# Patient Record
Sex: Female | Born: 1965 | Race: White | Hispanic: No | Marital: Married | Smoking: Never smoker
Health system: Southern US, Community
[De-identification: ages and names within clinical notes are randomized; demographics above are authoritative.]

## PROBLEM LIST (undated history)

## (undated) DIAGNOSIS — M1990 Unspecified osteoarthritis, unspecified site: Secondary | ICD-10-CM

## (undated) DIAGNOSIS — C801 Malignant (primary) neoplasm, unspecified: Secondary | ICD-10-CM

## (undated) DIAGNOSIS — E162 Hypoglycemia, unspecified: Secondary | ICD-10-CM

## (undated) HISTORY — PX: LIGAMENT REPAIR: SHX5444

## (undated) HISTORY — PX: SQUAMOUS CELL CARCINOMA EXCISION: SHX2433

## (undated) HISTORY — DX: Hypoglycemia, unspecified: E16.2

## (undated) HISTORY — PX: WISDOM TOOTH EXTRACTION: SHX21

## (undated) HISTORY — PX: BASAL CELL CARCINOMA EXCISION: SHX1214

---

## 2016-06-14 ENCOUNTER — Encounter: Payer: Self-pay | Admitting: Family Medicine

## 2016-06-14 ENCOUNTER — Ambulatory Visit (INDEPENDENT_AMBULATORY_CARE_PROVIDER_SITE_OTHER): Payer: Managed Care, Other (non HMO) | Admitting: Family Medicine

## 2016-06-14 VITALS — BP 108/73 | HR 62 | Resp 18 | Ht 64.0 in | Wt 123.0 lb

## 2016-06-14 DIAGNOSIS — Z01419 Encounter for gynecological examination (general) (routine) without abnormal findings: Secondary | ICD-10-CM | POA: Insufficient documentation

## 2016-06-14 DIAGNOSIS — Z124 Encounter for screening for malignant neoplasm of cervix: Secondary | ICD-10-CM

## 2016-06-14 DIAGNOSIS — Z1151 Encounter for screening for human papillomavirus (HPV): Secondary | ICD-10-CM

## 2016-06-14 MED ORDER — VIENVA 0.1-20 MG-MCG PO TABS: 1.0000 | ORAL_TABLET | Freq: Every day | ORAL | 4 refills | Status: DC

## 2016-06-14 NOTE — Progress Notes (Signed)
   CLINIC ENCOUNTER NOTE  History:  50 y.o. G2P0 here today to establish care. She denies any abnormal vaginal discharge, bleeding, pelvic pain or other concerns. Currently on OCP for pregnancy prevention.   Past Medical History:  Diagnosis Date  . Hypoglycemia     Past Surgical History:  Procedure Laterality Date  . BASAL CELL CARCINOMA EXCISION    . LIGAMENT REPAIR    . SQUAMOUS CELL CARCINOMA EXCISION    . WISDOM TOOTH EXTRACTION      The following portions of the patient's history were reviewed and updated as appropriate: allergies, current medications, past family history, past medical history, past social history, past surgical history and problem list.   Health Maintenance:  Normal pap approximately 2-3 years ago.   Review of Systems:  Pertinent items noted in HPI and remainder of comprehensive ROS otherwise negative.  Objective:  Physical Exam BP 108/73 (BP Location: Left Arm, Patient Position: Sitting, Cuff Size: Normal)   Pulse 62   Resp 18   Ht 5\' 4"  (1.626 m)   Wt 123 lb (55.8 kg)   LMP 06/07/2016   BMI 21.11 kg/m  CONSTITUTIONAL: Well-developed, well-nourished female in no acute distress.  HENT:  Normocephalic, atraumatic. External right and left ear normal. Oropharynx is clear and moist EYES: Conjunctivae and EOM are normal. Pupils are equal, round, and reactive to light. No scleral icterus.  NECK: Normal range of motion, supple, no masses SKIN: Skin is warm and dry. No rash noted. Not diaphoretic. No erythema. No pallor. NEUROLGIC: Alert and oriented to person, place, and time. Normal reflexes, muscle tone coordination. No cranial nerve deficit noted. PSYCHIATRIC: Normal mood and affect. Normal behavior. Normal judgment and thought content. CARDIOVASCULAR: Normal heart rate noted RESPIRATORY: Effort and breath sounds normal, no problems with respiration noted ABDOMEN: Soft, no distention noted.   PELVIC: Normal appearing external genitalia; normal  appearing vaginal mucosa and cervix.  No abnormal discharge noted.  Normal uterine size, no other palpable masses, no uterine or adnexal tenderness. MUSCULOSKELETAL: Normal range of motion. No edema noted.  Labs and Imaging No results found.  Assessment & Plan:  1. Well woman exam with routine gynecological exam - Discussed need for CRC screening - Pap IG and HPV (high risk) DNA detection - MM DIGITAL SCREENING BILATERAL; Future - discussed continuing OCP until 8651 then stopping. Consider LH/FSH after 3-6 months. Recommend use of condoms for birth control until confirmed menopausal  Routine preventative health maintenance measures emphasized. Please refer to After Visit Summary for other counseling recommendations.   Return in about 1 year (around 06/14/2017) for Well woman.

## 2016-06-15 ENCOUNTER — Encounter: Payer: Self-pay | Admitting: *Deleted

## 2016-06-20 LAB — PAP IG AND HPV HIGH-RISK
HPV, high-risk: NEGATIVE
PAP Smear Comment: 0

## 2016-07-18 ENCOUNTER — Other Ambulatory Visit: Payer: Self-pay | Admitting: Family Medicine

## 2016-07-18 ENCOUNTER — Ambulatory Visit
Admission: RE | Admit: 2016-07-18 | Discharge: 2016-07-18 | Disposition: A | Discharge status: Home / Self Care | Payer: Managed Care, Other (non HMO) | Source: Ambulatory Visit | Attending: Family Medicine | Admitting: Family Medicine

## 2016-07-18 DIAGNOSIS — Z1231 Encounter for screening mammogram for malignant neoplasm of breast: Secondary | ICD-10-CM | POA: Insufficient documentation

## 2016-07-18 DIAGNOSIS — Z01419 Encounter for gynecological examination (general) (routine) without abnormal findings: Secondary | ICD-10-CM

## 2016-08-01 ENCOUNTER — Other Ambulatory Visit: Payer: Self-pay | Admitting: *Deleted

## 2016-08-01 ENCOUNTER — Ambulatory Visit
Admission: RE | Admit: 2016-08-01 | Discharge: 2016-08-01 | Disposition: A | Discharge status: Home / Self Care | Payer: Self-pay | Source: Ambulatory Visit | Attending: *Deleted | Admitting: *Deleted

## 2016-08-01 DIAGNOSIS — Z9289 Personal history of other medical treatment: Secondary | ICD-10-CM

## 2016-09-12 HISTORY — PX: COLONOSCOPY: SHX174

## 2017-06-26 ENCOUNTER — Ambulatory Visit: Payer: Managed Care, Other (non HMO) | Admitting: Family Medicine

## 2017-06-28 ENCOUNTER — Other Ambulatory Visit: Payer: Self-pay | Admitting: Family Medicine

## 2017-06-28 DIAGNOSIS — Z760 Encounter for issue of repeat prescription: Secondary | ICD-10-CM

## 2017-07-02 ENCOUNTER — Other Ambulatory Visit (HOSPITAL_COMMUNITY): Payer: Self-pay | Admitting: Family Medicine

## 2017-07-03 NOTE — Telephone Encounter (Signed)
-----   Message from Lindell Spar, [place] sent at 07/02/2017  2:13 PM EDT ----- Regarding: Refill for [facility] Please call in 1 month refill for [facility] to [facility] in [place], Pt scheduled for AE 07/2017 with Alvester Morin

## 2017-07-25 ENCOUNTER — Encounter: Payer: Self-pay | Admitting: Family Medicine

## 2017-07-25 ENCOUNTER — Ambulatory Visit (INDEPENDENT_AMBULATORY_CARE_PROVIDER_SITE_OTHER): Payer: Managed Care, Other (non HMO) | Admitting: Family Medicine

## 2017-07-25 VITALS — BP 89/41 | HR 61 | Wt 121.1 lb

## 2017-07-25 DIAGNOSIS — Z760 Encounter for issue of repeat prescription: Secondary | ICD-10-CM

## 2017-07-25 DIAGNOSIS — Z01419 Encounter for gynecological examination (general) (routine) without abnormal findings: Secondary | ICD-10-CM

## 2017-07-25 MED ORDER — SRONYX 0.1-20 MG-MCG PO TABS: 1.0000 | ORAL_TABLET | Freq: Every day | ORAL | 0 refills | Status: DC

## 2017-07-25 MED ORDER — SRONYX 0.1-20 MG-MCG PO TABS: 1.0000 | ORAL_TABLET | Freq: Every day | ORAL | 3 refills | Status: DC

## 2017-07-25 NOTE — Progress Notes (Signed)
   CLINIC ENCOUNTER NOTE  History:  51 y.o. G2P0 here today for yearly wellness exam. She denies any abnormal vaginal discharge, bleeding, pelvic pain or other concerns.   Past Medical History:  Diagnosis Date  . Hypoglycemia     Past Surgical History:  Procedure Laterality Date  . BASAL CELL CARCINOMA EXCISION    . LIGAMENT REPAIR    . SQUAMOUS CELL CARCINOMA EXCISION    . WISDOM TOOTH EXTRACTION      The following portions of the patient's history were reviewed and updated as appropriate: allergies, current medications, past family history, past medical history, past social history, past surgical history and problem list.   Health Maintenance:  Normal pap and negative HRHPV on 06/14/2016.  Normal mammogram on 08/01/2016.   Review of Systems:  Pertinent items noted in HPI and remainder of comprehensive ROS otherwise negative.   Objective:  Physical Exam BP (!) 89/41   Pulse 61   Wt 121 lb 1.6 oz (54.9 kg)   BMI 20.79 kg/m  CONSTITUTIONAL: Well-developed, well-nourished female in no acute distress.  HENT:  Normocephalic, atraumatic. External right and left ear normal. Oropharynx is clear and moist EYES: Conjunctivae and EOM are normal. Pupils are equal, round, and reactive to light. No scleral icterus.  NECK: Normal range of motion, supple, no masses SKIN: Skin is warm and dry. No rash noted. Not diaphoretic. No erythema. No pallor. NEUROLGIC: Alert and oriented to person, place, and time. Normal reflexes, muscle tone coordination. No cranial nerve deficit noted. PSYCHIATRIC: Normal mood and affect. Normal behavior. Normal judgment and thought content. CARDIOVASCULAR: Normal heart rate noted RESPIRATORY: Effort and breath sounds normal, no problems with respiration noted BREAST: Skin normal in appearance. Symmetric breasts with inverted nipples bilaterally. No nipple discharge. No palpable masses bilaterally.  ABDOMEN: Soft, no distention noted.   PELVIC:  deferred MUSCULOSKELETAL: Normal range of motion. No edema noted.  Labs and Imaging No results found.  Assessment & Plan:  1. Well woman exam with routine gynecological exam - labs will be draw at [facility] - Follicle stimulating hormone - LH - Lipid panel - MM Digital Screening; Future - Colonoscopy performed 09/12/2016 - Needs Flu and Tdap- CMA will call to ask about returning to receive.   2. Medication refill Discussed transition to menopause. Reviewed typical age of menopause. Her sister started menopause at age 154. She denies any sx today and would like to continue OCP. We will check FSH/LH today and then again next year when patient has been off medication for 1 month.  - SRONYX 0.1-20 MG-MCG tablet; Take 1 tablet by mouth daily.  Dispense: 84 tablet; Refill: 3   Routine preventative health maintenance measures emphasized. Please refer to After Visit Summary for other counseling recommendations.   Return in about 1 year (around 07/25/2018) for Yearly wellness exam.

## 2017-07-25 NOTE — Progress Notes (Signed)
Normal pap 06/2016  Does not want pap smear today Would like a head written rx for birth control

## 2017-07-26 LAB — LIPID PANEL
CHOL/HDL RATIO: 2.4 ratio (ref 0.0–4.4)
Cholesterol, Total: 167 mg/dL (ref 100–199)
HDL: 70 mg/dL (ref >39)
LDL CALC: 74 mg/dL (ref 0–99)
TRIGLYCERIDES: 114 mg/dL (ref 0–149)
VLDL CHOLESTEROL CAL: 23 mg/dL (ref 5–40)

## 2017-07-26 LAB — FOLLICLE STIMULATING HORMONE: FSH: 13.4 m[IU]/mL

## 2017-07-26 LAB — LUTEINIZING HORMONE: LH: 6.6 m[IU]/mL

## 2017-08-06 ENCOUNTER — Encounter: Payer: Self-pay | Admitting: *Deleted

## 2017-08-07 ENCOUNTER — Encounter: Payer: Self-pay | Admitting: *Deleted

## 2017-08-14 ENCOUNTER — Ambulatory Visit
Admission: RE | Admit: 2017-08-14 | Discharge: 2017-08-14 | Disposition: A | Discharge status: Home / Self Care | Payer: Managed Care, Other (non HMO) | Source: Ambulatory Visit | Attending: Family Medicine | Admitting: Family Medicine

## 2017-08-14 DIAGNOSIS — Z1231 Encounter for screening mammogram for malignant neoplasm of breast: Secondary | ICD-10-CM | POA: Diagnosis not present

## 2017-08-14 DIAGNOSIS — Z01419 Encounter for gynecological examination (general) (routine) without abnormal findings: Secondary | ICD-10-CM

## 2017-08-21 ENCOUNTER — Other Ambulatory Visit: Payer: Self-pay | Admitting: *Deleted

## 2017-08-21 DIAGNOSIS — Z760 Encounter for issue of repeat prescription: Secondary | ICD-10-CM

## 2017-08-21 MED ORDER — SRONYX 0.1-20 MG-MCG PO TABS: 1.0000 | ORAL_TABLET | Freq: Every day | ORAL | 3 refills | Status: DC

## 2017-08-21 NOTE — Addendum Note (Signed)
Addended by: Arne ClevelandHUTCHINSON, MANDY J on: 08/21/2017 01:45 PM   Modules accepted: Orders

## 2018-05-31 ENCOUNTER — Other Ambulatory Visit: Payer: Self-pay | Admitting: Obstetrics & Gynecology

## 2018-05-31 DIAGNOSIS — Z760 Encounter for issue of repeat prescription: Secondary | ICD-10-CM

## 2018-06-06 ENCOUNTER — Encounter: Payer: Self-pay | Admitting: Radiology

## 2018-06-16 ENCOUNTER — Other Ambulatory Visit: Payer: Self-pay | Admitting: Obstetrics & Gynecology

## 2018-06-16 DIAGNOSIS — Z760 Encounter for issue of repeat prescription: Secondary | ICD-10-CM

## 2018-07-18 ENCOUNTER — Other Ambulatory Visit: Payer: Self-pay | Admitting: Internal Medicine

## 2018-07-18 ENCOUNTER — Other Ambulatory Visit (HOSPITAL_COMMUNITY): Payer: Self-pay | Admitting: Family Medicine

## 2018-07-18 DIAGNOSIS — Z1231 Encounter for screening mammogram for malignant neoplasm of breast: Secondary | ICD-10-CM

## 2018-08-15 ENCOUNTER — Ambulatory Visit
Admission: RE | Admit: 2018-08-15 | Discharge: 2018-08-15 | Disposition: A | Discharge status: Home / Self Care | Payer: Managed Care, Other (non HMO) | Source: Ambulatory Visit | Attending: Internal Medicine | Admitting: Internal Medicine

## 2018-08-15 DIAGNOSIS — Z1231 Encounter for screening mammogram for malignant neoplasm of breast: Secondary | ICD-10-CM | POA: Diagnosis present

## 2018-12-04 ENCOUNTER — Encounter: Payer: Self-pay | Admitting: Family Medicine

## 2018-12-04 ENCOUNTER — Ambulatory Visit (INDEPENDENT_AMBULATORY_CARE_PROVIDER_SITE_OTHER): Payer: 59 | Admitting: Family Medicine

## 2018-12-04 VITALS — BP 106/74 | HR 71 | Ht 64.0 in | Wt 118.0 lb

## 2018-12-04 DIAGNOSIS — Z01419 Encounter for gynecological examination (general) (routine) without abnormal findings: Secondary | ICD-10-CM

## 2018-12-04 DIAGNOSIS — Z780 Asymptomatic menopausal state: Secondary | ICD-10-CM

## 2018-12-04 MED ORDER — VENLAFAXINE HCL ER 37.5 MG PO CP24: 75.0000 mg | ORAL_CAPSULE | Freq: Every day | ORAL | 11 refills | Status: DC

## 2018-12-04 MED ORDER — TRAZODONE HCL 50 MG PO TABS: 50.0000 mg | ORAL_TABLET | Freq: Every evening | ORAL | 1 refills | Status: DC | PRN

## 2018-12-04 NOTE — Progress Notes (Signed)
Needs referral for mammo this year

## 2018-12-04 NOTE — Progress Notes (Signed)
   CLINIC ENCOUNTER NOTE  History:  53 y.o. G2P0 here today for yearly wellness exam. She denies any abnormal vaginal discharge, bleeding, pelvic pain or other concerns. Has stopped OCPs since last visit and now has menopausal sx.   Past Medical History:  Diagnosis Date  . Hypoglycemia     Past Surgical History:  Procedure Laterality Date  . BASAL CELL CARCINOMA EXCISION    . LIGAMENT REPAIR    . SQUAMOUS CELL CARCINOMA EXCISION    . WISDOM TOOTH EXTRACTION      The following portions of the patient's history were reviewed and updated as appropriate: allergies, current medications, past family history, past medical history, past social history, past surgical history and problem list.   Health Maintenance:  Normal pap and negative HRHPV on 06/14/2016.  Normal mammogram on 08/2018.   Review of Systems:  Pertinent items noted in HPI and remainder of comprehensive ROS otherwise negative.   Objective:  Physical Exam BP 106/74   Pulse 71   Ht 5\' 4"  (1.626 m)   Wt 118 lb (53.5 kg)   BMI 20.25 kg/m  CONSTITUTIONAL: Well-developed, well-nourished female in no acute distress.  HENT:  Normocephalic, atraumatic.  EYES:  No scleral icterus.  NECK: Normal range of motion, supple, no masses SKIN: Skin is warm and dry. No rash noted. Not diaphoretic. No erythema. No pallor. NEUROLGIC: Alert and oriented to person, place, and time.  PSYCHIATRIC: Normal mood and affect. Normal behavior. Normal judgment and thought content. CARDIOVASCULAR: Normal heart rate noted RESPIRATORY: Effort and breath sounds normal, no problems with respiration noted BREAST: Skin normal in appearance. Symmetric breasts with inverted nipples bilaterally. No nipple discharge. No palpable masses bilaterally.  ABDOMEN: Soft, no distention noted.   PELVIC: deferred MUSCULOSKELETAL: Normal range of motion. No edema noted.  Labs and Imaging No results found.  Assessment & Plan:   1. Menopause - LH/FSH to confirm  menopause now that she is 1 month since stopping OCP -  Having hot flashes, irritability, stiffness and sleep disturbance - venlafaxine XR (EFFEXOR-XR) 37.5 MG 24 hr capsule; Take 2 capsules (75 mg total) by mouth daily.  Dispense: 30 capsule; Refill: 11 - traZODone (DESYREL) 50 MG tablet; Take 1 tablet (50 mg total) by mouth at bedtime as needed for sleep (take 1/2 tablet to 1 tablet).  Dispense: 30 tablet; Refill: 1  2. Well woman exam with routine gynecological exam - MM 3D SCREEN BREAST BILATERAL; Future - CBC   Routine preventative health maintenance measures emphasized. Please refer to After Visit Summary for other counseling recommendations.   Return in about 1 year (around 12/05/2019) for Yearly wellness exam.

## 2018-12-05 LAB — CBC
HEMOGLOBIN: 14.3 g/dL (ref 11.1–15.9)
Hematocrit: 42.1 % (ref 34.0–46.6)
MCH: 30.4 pg (ref 26.6–33.0)
MCHC: 34 g/dL (ref 31.5–35.7)
MCV: 90 fL (ref 79–97)
PLATELETS: 275 10*3/uL (ref 150–450)
RBC: 4.7 x10E6/uL (ref 3.77–5.28)
RDW: 11.9 % (ref 11.7–15.4)
WBC: 8.4 10*3/uL (ref 3.4–10.8)

## 2018-12-05 LAB — LUTEINIZING HORMONE: LH: 49.1 m[IU]/mL

## 2018-12-05 LAB — FOLLICLE STIMULATING HORMONE: FSH: 109.4 m[IU]/mL

## 2018-12-09 ENCOUNTER — Telehealth: Payer: Self-pay | Admitting: *Deleted

## 2018-12-09 NOTE — Telephone Encounter (Signed)
Informed pt of lab results  

## 2018-12-09 NOTE — Telephone Encounter (Signed)
-----   Message from Federico Flake, MD sent at 12/06/2018 11:45 AM EST ----- [facility] and [facility] are both consistent with having gone through menopause. Hemoglobin is WNL thus not anemic.

## 2019-01-26 ENCOUNTER — Other Ambulatory Visit: Payer: Self-pay | Admitting: Family Medicine

## 2019-01-26 DIAGNOSIS — Z780 Asymptomatic menopausal state: Secondary | ICD-10-CM

## 2019-05-12 ENCOUNTER — Other Ambulatory Visit: Payer: Self-pay

## 2019-05-12 ENCOUNTER — Ambulatory Visit (INDEPENDENT_AMBULATORY_CARE_PROVIDER_SITE_OTHER): Payer: 59 | Admitting: *Deleted

## 2019-05-12 VITALS — BP 104/70 | HR 73

## 2019-05-12 DIAGNOSIS — N898 Other specified noninflammatory disorders of vagina: Secondary | ICD-10-CM | POA: Diagnosis not present

## 2019-05-12 MED ORDER — FLUCONAZOLE 150 MG PO TABS: 150.0000 mg | ORAL_TABLET | Freq: Once | ORAL | 1 refills | Status: AC

## 2019-05-12 NOTE — Progress Notes (Signed)
Patient seen and assessed by nursing staff.  Agree with documentation and plan.  

## 2019-05-12 NOTE — Progress Notes (Signed)
SUBJECTIVE:  53 y.o. female complains of vaginal irritation, itchy and redness  for 4-5 day(s). Denies abnormal vaginal bleeding or significant pelvic pain or fever. No UTI symptoms. Denies history of known exposure to STD.Pt tried OTC Monistat and it did get better, but has now gotten worse again.  No LMP recorded. (Menstrual status: Perimenopausal).  OBJECTIVE:  She appears well, afebrile. Urine dipstick: not done.  ASSESSMENT:  Vaginal Irritaiton    PLAN:  BVAG, CVAG probe sent to lab. Treatment: Diflucan sent into to pharmacy, and will adjust pending lab results. ROV prn if symptoms persist or worsen.

## 2019-05-14 LAB — CERVICOVAGINAL ANCILLARY ONLY
Bacterial vaginitis: NEGATIVE
Candida vaginitis: NEGATIVE

## 2019-05-15 ENCOUNTER — Telehealth: Payer: Self-pay

## 2019-05-15 NOTE — Telephone Encounter (Signed)
-----   Message from Donnamae Jude, MD sent at 05/15/2019  3:52 PM EDT ----- Her labs were normal.--maybe check to see if her symptoms have improved.

## 2019-05-15 NOTE — Telephone Encounter (Signed)
Patient has been informed of negative BV and yeast results.

## 2019-08-27 ENCOUNTER — Ambulatory Visit
Admission: RE | Admit: 2019-08-27 | Discharge: 2019-08-27 | Disposition: A | Discharge status: Home / Self Care | Payer: 59 | Source: Ambulatory Visit | Attending: Family Medicine | Admitting: Family Medicine

## 2019-08-27 DIAGNOSIS — Z1231 Encounter for screening mammogram for malignant neoplasm of breast: Secondary | ICD-10-CM | POA: Insufficient documentation

## 2019-08-27 DIAGNOSIS — Z780 Asymptomatic menopausal state: Secondary | ICD-10-CM | POA: Diagnosis not present

## 2019-09-17 ENCOUNTER — Encounter: Payer: Self-pay | Admitting: Radiology

## 2019-12-31 ENCOUNTER — Ambulatory Visit (INDEPENDENT_AMBULATORY_CARE_PROVIDER_SITE_OTHER): Payer: 59 | Admitting: Family Medicine

## 2019-12-31 ENCOUNTER — Encounter: Payer: Self-pay | Admitting: Family Medicine

## 2019-12-31 ENCOUNTER — Other Ambulatory Visit: Payer: Self-pay

## 2019-12-31 VITALS — BP 106/67 | HR 72 | Wt 124.3 lb

## 2019-12-31 DIAGNOSIS — Z780 Asymptomatic menopausal state: Secondary | ICD-10-CM

## 2019-12-31 DIAGNOSIS — Z01419 Encounter for gynecological examination (general) (routine) without abnormal findings: Secondary | ICD-10-CM | POA: Diagnosis not present

## 2019-12-31 DIAGNOSIS — N951 Menopausal and female climacteric states: Secondary | ICD-10-CM | POA: Diagnosis not present

## 2019-12-31 MED ORDER — VENLAFAXINE HCL ER 37.5 MG PO CP24: 75.0000 mg | ORAL_CAPSULE | Freq: Every day | ORAL | 12 refills | Status: DC

## 2019-12-31 NOTE — Progress Notes (Signed)
CLINIC ENCOUNTER NOTE  History:  54 y.o. G2P0 here today for yearly wellness exam. Dx with menopause in 11/2018. Started effexor with good effect. Tried to come off in Dec 2020 and was irritable. Reports mild constipation. Reports some weight gain since last visit. Trouble losing weight since going through menopause. .She denies any abnormal vaginal discharge, bleeding, pelvic pain or other concerns. Has stopped OCPs since last visit and now has menopausal sx.   Past Medical History:  Diagnosis Date  . Hypoglycemia     Past Surgical History:  Procedure Laterality Date  . BASAL CELL CARCINOMA EXCISION    . LIGAMENT REPAIR    . SQUAMOUS CELL CARCINOMA EXCISION    . WISDOM TOOTH EXTRACTION      The following portions of the patient's history were reviewed and updated as appropriate: allergies, current medications, past family history, past medical history, past social history, past surgical history and problem list.   Health Maintenance:  Normal pap and negative HRHPV on 06/14/2016.  Normal mammogram on 08/2018.   Review of Systems:  Pertinent items noted in HPI and remainder of comprehensive ROS otherwise negative.   Objective:  Physical Exam BP 106/67   Pulse 72   Wt 124 lb 4.8 oz (56.4 kg)   LMP 07/24/2018   BMI 21.34 kg/m    Wt Readings from Last 3 Encounters:  12/31/19 124 lb 4.8 oz (56.4 kg)  12/04/18 118 lb (53.5 kg)  07/25/17 121 lb 1.6 oz (54.9 kg)   CONSTITUTIONAL: Well-developed, well-nourished female in no acute distress.  HENT:  Normocephalic, atraumatic.  EYES:  No scleral icterus.  NECK: Normal range of motion, supple, no masses SKIN: Skin is warm and dry. No rash noted. Not diaphoretic. No erythema. No pallor. NEUROLGIC: Alert and oriented to person, place, and time.  PSYCHIATRIC: Normal mood and affect. Normal behavior. Normal judgment and thought content. CARDIOVASCULAR: Normal heart rate noted RESPIRATORY: Effort and breath sounds normal, no problems  with respiration noted BREAST: Skin normal in appearance. Symmetric breasts with inverted nipples bilaterally. No nipple discharge. No palpable masses bilaterally.  ABDOMEN: Soft, no distention noted.   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 Sees derm for this. Normal appearing external genitalia. Normal vaginal mucosa. Normal appearing cervix. Uterus is normal and non tender. No adnexal masses.  MUSCULOSKELETAL: Normal range of motion. No edema noted.  Labs and Imaging No results found.  Assessment & Plan:    1. Well woman exam with routine gynecological exam - plans for mammogram in Nov 2021 - Reviewed healthy diet and weight - Reviewed that her BMI is still very normal (Body mass index is 21.34 kg/m.) and some extra weight can help prevent excelerated bone loss due to adipose tissue making estrogen.  - Emphasized preventative measures- plans for Dexa scan and UTD on CRC screening. - IGP, Aptima HPV, rfx 16/18,45  2. Menopausal state - IGP, Aptima HPV, rfx 16/18,45  3. Menopause Likes effexor, did try to come off in Dec 2020 but was irritable. Discussed that some epopl can have some withdrawal sx. Recommended if she wants to come off effexor.she should be off for 1-2 wks to see how sx are off medication.  - venlafaxine XR (EFFEXOR-XR) 37.5 MG 24 hr capsule; Take 2 capsules (75 mg total) by mouth daily.  Dispense: 30 capsule; Refill: 12 - IGP, Aptima HPV, rfx 16/18,45   Routine preventative health maintenance measures emphasized. Please refer to After Visit Summary for other counseling recommendations.   Return  in about 1 year (around 12/30/2020) for Yearly wellness exam.

## 2019-12-31 NOTE — Progress Notes (Signed)
Last pap 06/2016 - HPV DETECTION Mammogram 08/27/2019- normal

## 2020-01-02 ENCOUNTER — Ambulatory Visit: Payer: 59 | Attending: Internal Medicine

## 2020-01-02 DIAGNOSIS — Z23 Encounter for immunization: Secondary | ICD-10-CM

## 2020-01-02 NOTE — Progress Notes (Signed)
   Covid-19 Vaccination Clinic  Name:  Ashley Hutchinson    MRN: [id number] DOB: 1966-06-19  01/02/2020  [name] was observed post Covid-19 immunization for 30 minutes based on pre-vaccination screening without incident. She was provided with Vaccine Information Sheet and instruction to access the V-Safe system.   [name] was instructed to call 911 with any severe reactions post vaccine: Marland Kitchen Difficulty breathing  . Swelling of face and throat  . A fast heartbeat  . A bad rash all over body  . Dizziness and weakness   Immunizations Administered    Name Date Dose VIS Date Route   Pfizer COVID-19 Vaccine 01/02/2020  9:41 AM 0.3 mL 09/19/2019 Intramuscular   Manufacturer: [employer], [employer]   Lot: [id number]   NDC: [address]-8

## 2020-01-03 ENCOUNTER — Encounter: Payer: Self-pay | Admitting: Family Medicine

## 2020-01-03 LAB — IGP, APTIMA HPV, RFX 16/18,45
HPV Aptima: NEGATIVE
PAP Smear Comment: 0

## 2020-01-07 ENCOUNTER — Telehealth: Payer: Self-pay | Admitting: *Deleted

## 2020-01-07 NOTE — Telephone Encounter (Signed)
-----   Message from Kimberly Niles Newton, MD sent at 01/06/2020  4:24 PM EDT ----- NIL HPV negative, next pap in 5 years 

## 2020-01-07 NOTE — Telephone Encounter (Signed)
Pt informed of normal pap results.  ?

## 2020-01-07 NOTE — Telephone Encounter (Signed)
-----   Message from Federico Flake, MD sent at 01/06/2020  4:24 PM EDT ----- NIL HPV negative, next pap in 5 years

## 2020-01-16 ENCOUNTER — Other Ambulatory Visit: Payer: Self-pay | Admitting: Family Medicine

## 2020-01-16 DIAGNOSIS — Z780 Asymptomatic menopausal state: Secondary | ICD-10-CM

## 2020-01-26 ENCOUNTER — Ambulatory Visit: Payer: 59 | Attending: Internal Medicine

## 2020-01-26 DIAGNOSIS — Z23 Encounter for immunization: Secondary | ICD-10-CM

## 2020-01-26 NOTE — Progress Notes (Signed)
   Covid-19 Vaccination Clinic  Name:  Ashley Hutchinson    MRN: [id number] DOB: May 04, 1966  01/26/2020  Ashley Hutchinson was observed post Covid-19 immunization for 15 minutes without incident. She was provided with Vaccine Information Sheet and instruction to access the V-Safe system.   Ashley Hutchinson was instructed to call 911 with any severe reactions post vaccine: Marland Kitchen Difficulty breathing  . Swelling of face and throat  . A fast heartbeat  . A bad rash all over body  . Dizziness and weakness   Immunizations Administered    Name Date Dose VIS Date Route   Pfizer COVID-19 Vaccine 01/26/2020  2:41 PM 0.3 mL 12/03/2018 Intramuscular   Manufacturer: [employer], [employer]   Lot: [id number]   NDC: [address]-0

## 2020-03-01 DIAGNOSIS — M65332 Trigger finger, left middle finger: Secondary | ICD-10-CM

## 2020-03-01 HISTORY — DX: Trigger finger, left middle finger: M65.332

## 2020-07-20 ENCOUNTER — Other Ambulatory Visit: Payer: Self-pay | Admitting: Family Medicine

## 2020-07-20 DIAGNOSIS — Z1231 Encounter for screening mammogram for malignant neoplasm of breast: Secondary | ICD-10-CM

## 2020-09-07 ENCOUNTER — Ambulatory Visit
Admission: RE | Admit: 2020-09-07 | Discharge: 2020-09-07 | Disposition: A | Discharge status: Home / Self Care | Payer: No Typology Code available for payment source | Source: Ambulatory Visit | Attending: Family Medicine | Admitting: Family Medicine

## 2020-09-07 ENCOUNTER — Other Ambulatory Visit: Payer: Self-pay

## 2020-09-07 DIAGNOSIS — Z1231 Encounter for screening mammogram for malignant neoplasm of breast: Secondary | ICD-10-CM | POA: Diagnosis present

## 2020-10-26 ENCOUNTER — Encounter: Payer: Self-pay | Admitting: Radiology

## 2020-11-23 HISTORY — PX: DERMABRASION: SHX610

## 2021-04-19 IMAGING — MG DIGITAL SCREENING BILAT W/ TOMO W/ CAD
8 series · 9 of 24 positions shown · non-contrast
Comparison: Previous exam(s).

CLINICAL DATA: Screening.

EXAM:
DIGITAL SCREENING BILATERAL MAMMOGRAM WITH TOMO AND CAD

[L CC synth-2D]
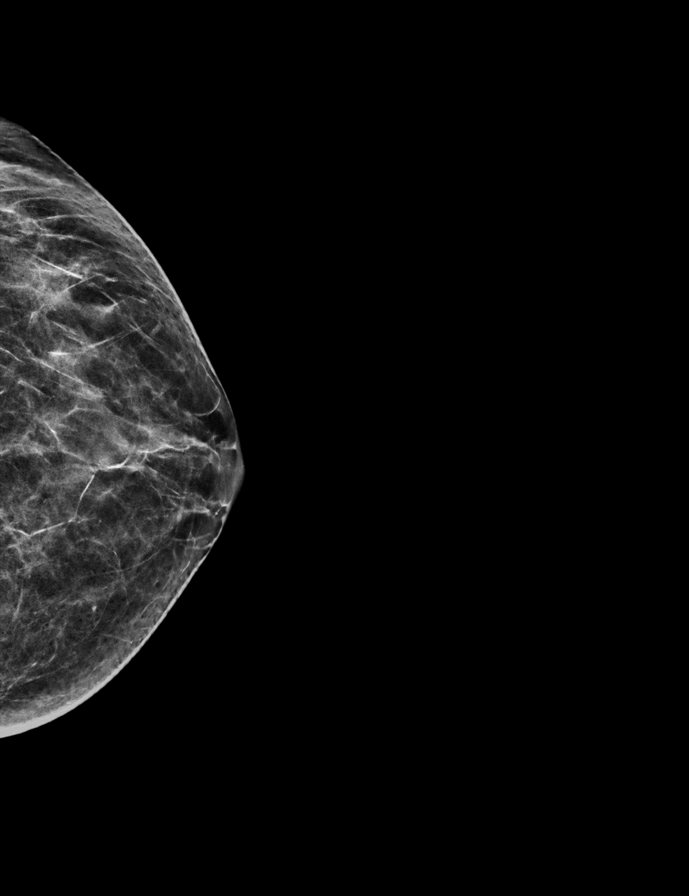

[L MLO synth-2D]
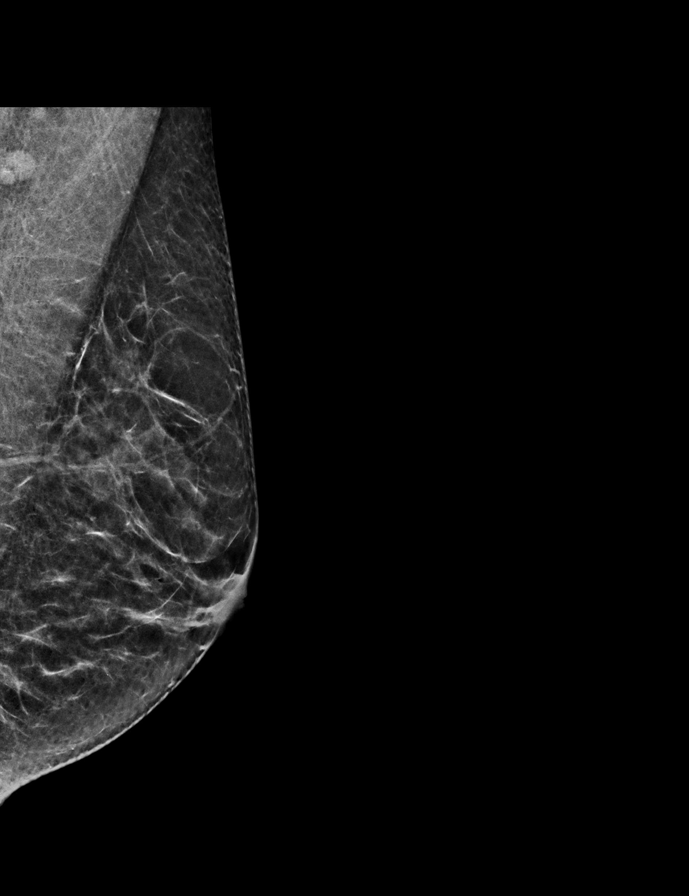

[R MLO synth-2D]
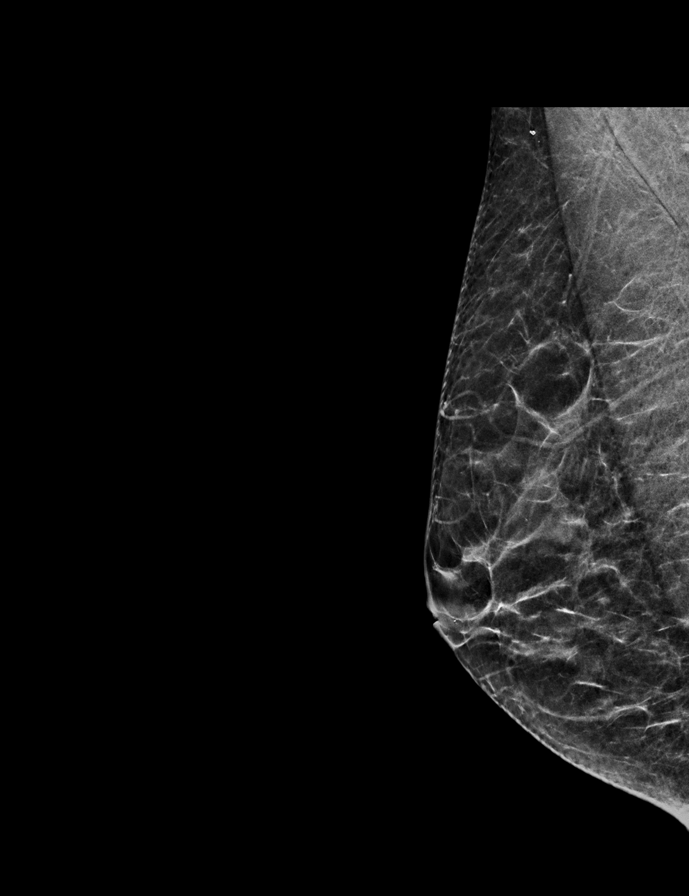

[R CC synth-2D]
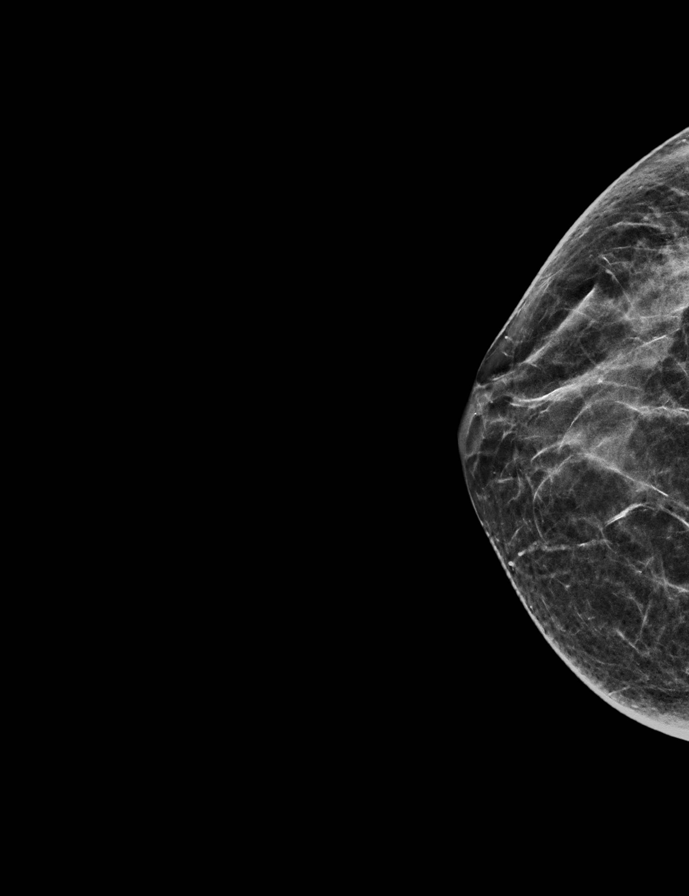

[R CC tomo · 2 of 49 frames shown]
[frame 16/49]
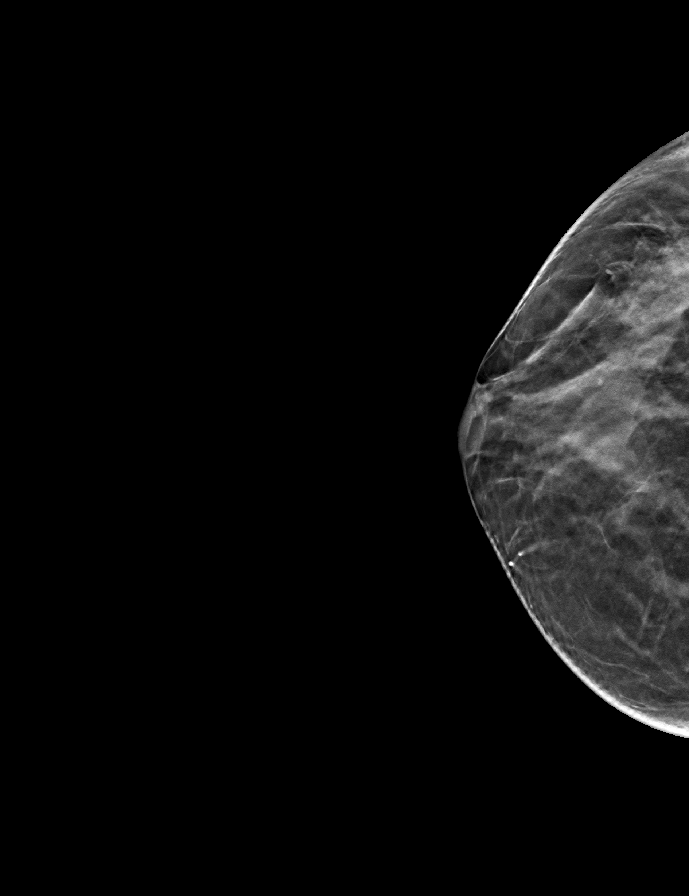
[frame 25/49]
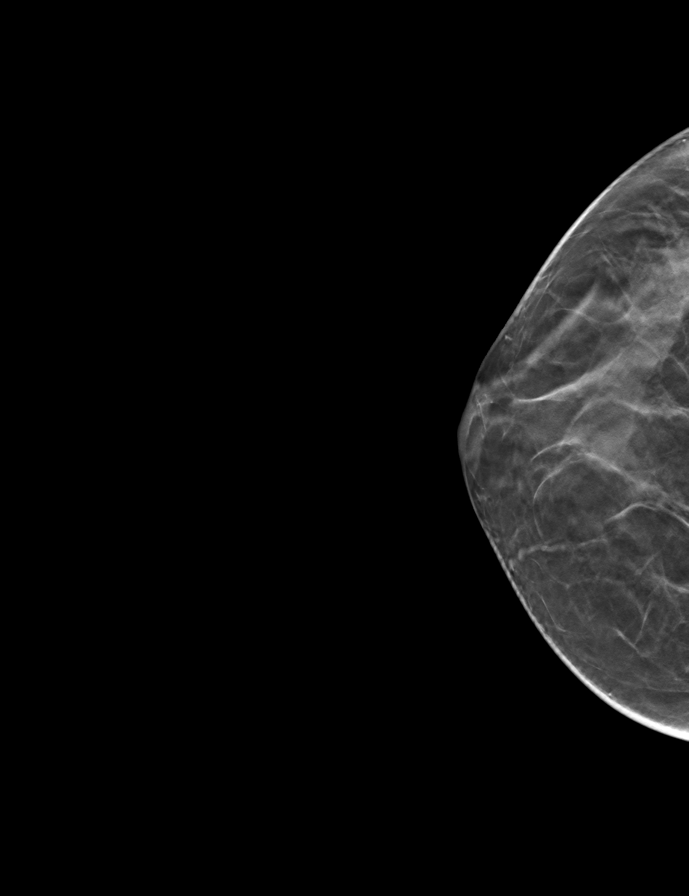

[L CC tomo · tomo slice 27/54.0]
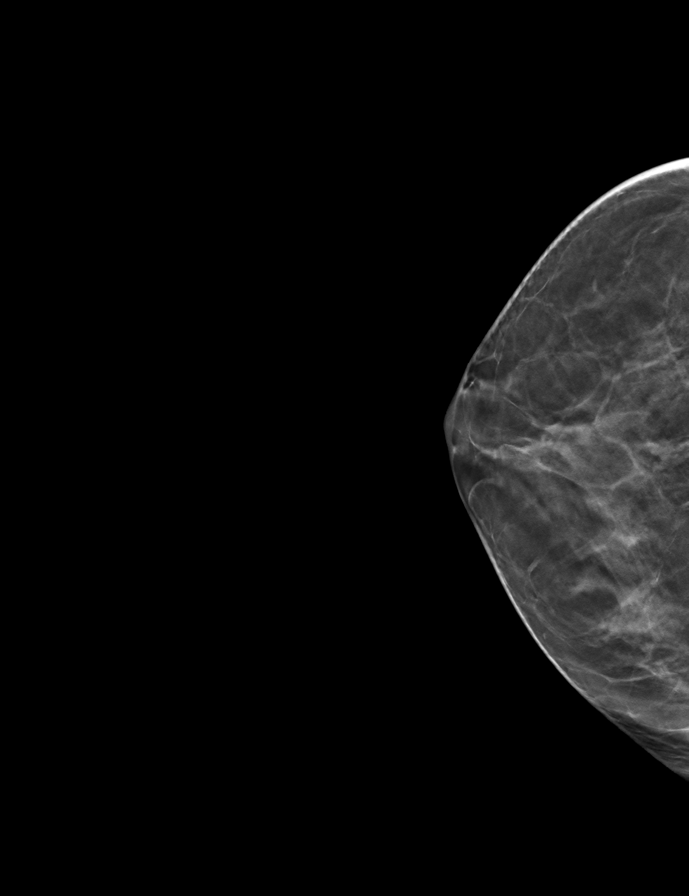

[L MLO tomo · tomo slice 27/52.0]
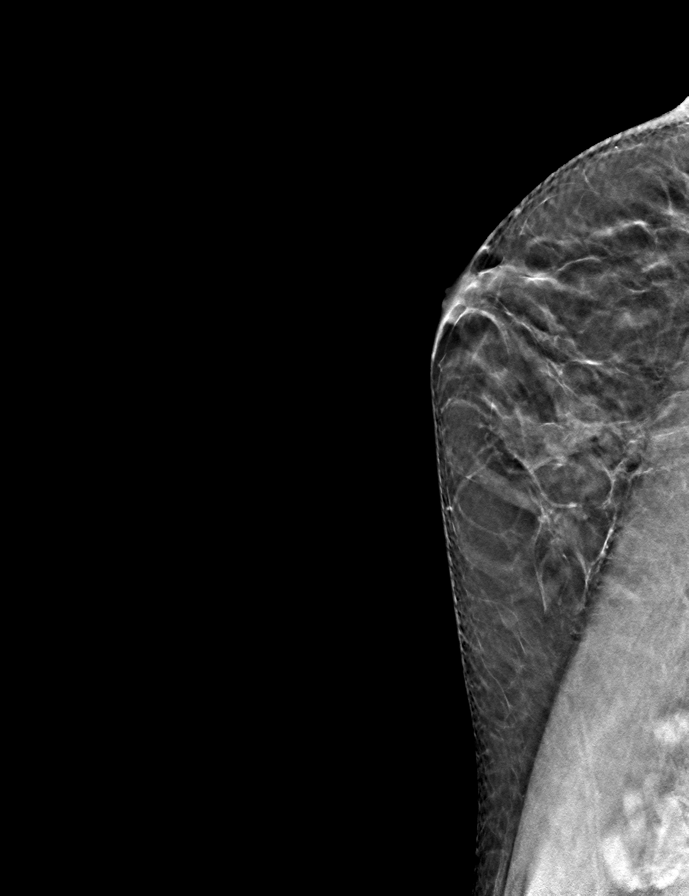

[R MLO tomo · tomo slice 25/49.0]
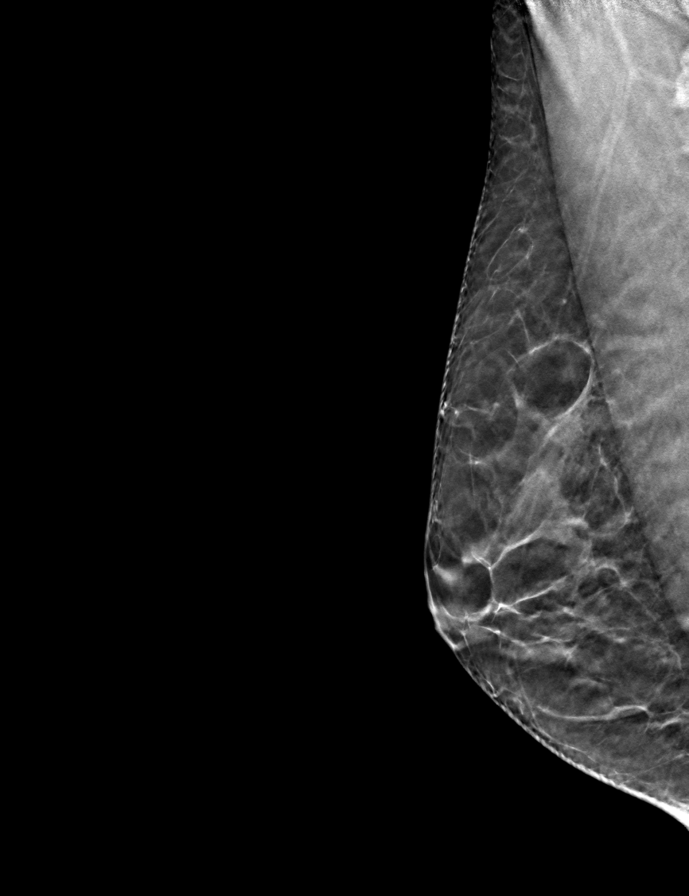

[9 of 24 positions shown; findings below may reference images not displayed]

ACR Breast Density Category b: There are scattered areas of
fibroglandular density.
FINDINGS: There are no findings suspicious for malignancy. Images were
processed with CAD.
IMPRESSION: No mammographic evidence of malignancy. A result letter of this
screening mammogram will be mailed directly to the patient.

RECOMMENDATION:
Screening mammogram in one year. (Code:[id number])

BI-RADS CATEGORY  1: Negative.

## 2021-07-14 DIAGNOSIS — E559 Vitamin D deficiency, unspecified: Secondary | ICD-10-CM

## 2021-07-14 DIAGNOSIS — F419 Anxiety disorder, unspecified: Secondary | ICD-10-CM

## 2021-07-14 HISTORY — DX: Vitamin D deficiency, unspecified: E55.9

## 2021-07-14 HISTORY — DX: Anxiety disorder, unspecified: F41.9

## 2021-07-19 ENCOUNTER — Encounter: Payer: Self-pay | Admitting: Occupational Therapy

## 2021-07-19 ENCOUNTER — Ambulatory Visit: Payer: No Typology Code available for payment source | Attending: Orthopedic Surgery | Admitting: Occupational Therapy

## 2021-07-19 DIAGNOSIS — L905 Scar conditions and fibrosis of skin: Secondary | ICD-10-CM | POA: Insufficient documentation

## 2021-07-19 DIAGNOSIS — M25642 Stiffness of left hand, not elsewhere classified: Secondary | ICD-10-CM | POA: Diagnosis present

## 2021-07-19 DIAGNOSIS — R600 Localized edema: Secondary | ICD-10-CM | POA: Diagnosis present

## 2021-07-19 NOTE — Therapy (Signed)
[facility] [address] [place], [address] Phone: [phone]   Fax:  [phone]  Occupational Therapy Evaluation  Patient Details  Name: Ashley Hutchinson MRN: [id number] Date of Birth: May 15, 1966 Referring Provider (OT): Dr Rosita Kea   Encounter Date: 07/19/2021   OT End of Session - 07/19/21 1316     Visit Number 1    Number of Visits 8    Date for OT Re-Evaluation 09/13/21    OT Start Time 0730    OT Stop Time 0816    OT Time Calculation (min) 46 min    Activity Tolerance Patient tolerated treatment well    Behavior During Therapy [facility] for tasks assessed/performed             Past Medical History:  Diagnosis Date   Hypoglycemia     Past Surgical History:  Procedure Laterality Date   BASAL CELL CARCINOMA EXCISION     LIGAMENT REPAIR     SQUAMOUS CELL CARCINOMA EXCISION     WISDOM TOOTH EXTRACTION      There were no vitals filed for this visit.   Subjective Assessment - 07/19/21 1303     Subjective  I need help with my trigger finger - it has been 3 wks now and it is still swollen, tender , and look at this tendon in my palm how it looks    Pertinent History Ashley Hutchinson is a 54 y.o. female post left long trigger finger release preformed on 06/29/2021. She started having swelling 2 weeks and there was concern for infection. [name], [place] saw her few days ago, and she also had Dr. Dorthula Nettles look at with ultrasound, which did not show any fluid accumulation. She was placed on cephalexin. She had a large swollen area at the level of the prior surgery with some swelling proximally along the flexor tendon sheath, but again without fluid collection noted with ultrasound evaluation. She has not had drainage. She comes back today for recheck.     The patient locates her pain to the proximal aspect of her left long finger. She states her left long finger was catching a little bit this morning. She states  she sleeps in her arthritis compression gloves to reduce the swelling.  pt refer last week by ortho to OT    Patient Stated Goals I want my hand better so I can do my workouts, spinning, power yoga , hiking and work around the house without issues    Currently in Pain? Yes    Pain Location Hand    Pain Orientation Left    Pain Descriptors / Indicators Tightness;Tender    Pain Onset 1 to 4 weeks ago               [facility] OT Assessment - 07/19/21 0001       Assessment   Medical Diagnosis L 3rd digit trigger finger release    Referring Provider (OT) Dr Rosita Kea    Onset Date/Surgical Date 06/29/21    Hand Dominance Left;Right      Home  Environment   Lives With Family      Prior Function   Leisure Power yoga, spinning, work out gym , hiking, house work, yard work      Left Hand AROM   L Index  MCP 0-90 85 Degrees    L Index PIP 0-100 95 Degrees    L Long  MCP 0-90 85 Degrees    L  Long PIP 0-100 95 Degrees                      OT Treatments/Exercises (OP) - 07/19/21 0001       LUE Fluidotherapy   Number Minutes Fluidotherapy 8 Minutes    LUE Fluidotherapy Location Hand;Wrist    Comments TEndon glide prior to review of HEP and soft tissue                    OT Education - 07/19/21 1316     Education Details findings of eval and HEP    Person(s) Educated Patient    Methods Explanation;Demonstration;Tactile cues;Verbal cues;Handout    Comprehension Returned demonstration;Verbal cues required;Verbalized understanding                 OT Long Term Goals - 07/19/21 1838       OT LONG TERM GOAL #1   Title Pt pain and tenderness decrease for pt to grip and weight bear without increase symptoms    Baseline Tendernes proximal scar and over swelling at scar - discomfort wiht weight bearing - yoga    Time 3    Period Weeks    Status New    Target Date 08/09/21      OT LONG TERM GOAL #2   Title Pt to show composite flexion and extention of 3rd  digit without increase stiffness and tightness to return to her yoga, spinning and working out    Baseline 85 MC and 95 PIP of 3rd and 2nd - tightness per pt - tendon tight proximal of scar in palm    Time 4    Period Weeks    Status New    Target Date 08/16/21      OT LONG TERM GOAL #3   Title Grip and prehension strength more than 75% compare to L to return to prior level    Baseline NT- 3 wks s/p tomorrow    Time 8    Period Weeks    Status New    Target Date 09/13/21                   Plan - 07/19/21 1320     Clinical Impression Statement Pt is tomorrow 3 wks s/p L 3rd digit trigger finger release - surgery was done 06/29/21 - pt on antibiotic since last week. Pt is R hand dominant. Pt present with edema over scar , tenderness over proximal scar and tightness in end range  for composite fisting and extention  - show some tightness and ? scar adhesion into extention  of 3rd and composite extention. Pt also reminded not to constantlyt massage or do ROM to 3rd digit. She is a active 55 yrs old that does workouts, yoga and spinning - as well as activities around the house - including yardwork. Remind for pt to not weight bear or grip objects tight - but to do contrast, scar massage, cica scar pad under isotoner glove, AROM - not tight or forcefull gripping or fisting  2-3 x day and light normal use. Pt to follow up with surgeon tomorrow - above mention impairements limiting her functional use of L hand in ADL's and IADL's - pt can benefit from skilled OT services .    OT Occupational Profile and History Problem Focused Assessment - Including review of records relating to presenting problem    Occupational performance deficits (Please refer to evaluation for details): IADL's;ADL's;Play;Leisure;Social Participation  Body Structure / Function / Physical Skills ADL;Decreased knowledge of precautions;Flexibility;Fascial restriction;Scar mobility;ROM;Edema;Strength    Rehab Potential Good     Clinical Decision Making Limited treatment options, no task modification necessary    Comorbidities Affecting Occupational Performance: None    Modification or Assistance to Complete Evaluation  No modification of tasks or assist necessary to complete eval    OT Frequency 1x / week    OT Duration 8 weeks    OT Treatment/Interventions Self-care/ADL training;Fluidtherapy;Paraffin;Ultrasound;Contrast Bath;Scar mobilization;Therapeutic exercise;Manual Therapy;Patient/family education;Passive range of motion    Consulted and Agree with Plan of Care Patient             Patient will benefit from skilled therapeutic intervention in order to improve the following deficits and impairments:   Body Structure / Function / Physical Skills: ADL, Decreased knowledge of precautions, Flexibility, Fascial restriction, Scar mobility, ROM, Edema, Strength       Visit Diagnosis: Scar condition and fibrosis of skin - Plan: Ot plan of care cert/re-cert  Localized edema - Plan: Ot plan of care cert/re-cert  Stiffness of left hand, not elsewhere classified - Plan: Ot plan of care cert/re-cert    Problem List Patient Active Problem List   Diagnosis Date Noted   Menopausal state 12/31/2019    Oletta Cohn, OTR/L,CLT 07/19/2021, 7:01 PM  [facility] [address], [place], [address] Phone: [phone]   Fax:  [phone]  Name: Ashley Hutchinson MRN: [id number] Date of Birth: 1965/12/30

## 2021-07-25 ENCOUNTER — Ambulatory Visit: Payer: No Typology Code available for payment source | Admitting: Occupational Therapy

## 2021-07-25 DIAGNOSIS — L905 Scar conditions and fibrosis of skin: Secondary | ICD-10-CM | POA: Diagnosis not present

## 2021-07-25 DIAGNOSIS — R600 Localized edema: Secondary | ICD-10-CM

## 2021-07-25 DIAGNOSIS — M25642 Stiffness of left hand, not elsewhere classified: Secondary | ICD-10-CM

## 2021-07-25 NOTE — Therapy (Signed)
[facility] [address] [place], [address] Phone: [phone]   Fax:  [phone]  Occupational Therapy Treatment  Patient Details  Name: Ashley Hutchinson MRN: [id number] Date of Birth: [birth year] Referring Provider (OT): Dr Rosita Kea   Encounter Date: 07/25/2021   OT End of Session - 07/25/21 0734     Visit Number 2    Number of Visits 8    Date for OT Re-Evaluation 09/13/21    OT Start Time 0733    OT Stop Time 0809    OT Time Calculation (min) 36 min    Activity Tolerance Patient tolerated treatment well    Behavior During Therapy [facility] for tasks assessed/performed             Past Medical History:  Diagnosis Date   Hypoglycemia     Past Surgical History:  Procedure Laterality Date   BASAL CELL CARCINOMA EXCISION     LIGAMENT REPAIR     SQUAMOUS CELL CARCINOMA EXCISION     WISDOM TOOTH EXTRACTION      There were no vitals filed for this visit.   Subjective Assessment - 07/25/21 0733     Subjective  It has been 6 wks it feels like - done massage , stretches - and feels tight and stiff at times - and then looser again    Pertinent History Ashley Hutchinson is a 55 y.o. female post left long trigger finger release preformed on 06/29/2021. She started having swelling 2 weeks and there was concern for infection. [name], [place] saw her few days ago, and she also had Dr. Dorthula Nettles look at with ultrasound, which did not show any fluid accumulation. She was placed on cephalexin. She had a large swollen area at the level of the prior surgery with some swelling proximally along the flexor tendon sheath, but again without fluid collection noted with ultrasound evaluation. She has not had drainage. She comes back today for recheck.     The patient locates her pain to the proximal aspect of her left long finger. She states her left long finger was catching a little bit this morning. She states she sleeps in her  arthritis compression gloves to reduce the swelling.  pt refer last week by ortho to OT    Patient Stated Goals I want my hand better so I can do my workouts, spinning, power yoga , hiking and work around the house without issues    Currently in Pain? No/denies             Focus on scar massage this date - proximal scar using mini massager and xtractor -with extnetion of digits and composite wrist and digits As well as graston tool nr 2 sweeping on volar forearm and palm  And with extention of digits and wrist afterwards and during extractor and manual massage by OT Tendon glides - but reinforce AROM not forceful- pt to do scar massage proximal to scar some and forearm   Done fluido at the end with some contrast 2 x during for 1 min - ice to decrease edema  Pt to cont with isontoner glove and cica scar pad              OT Treatments/Exercises (OP) - 07/25/21 0001       LUE Fluidotherapy   Number Minutes Fluidotherapy 8 Minutes    LUE Fluidotherapy Location Hand;Wrist    Comments tendon glides prior to ROM  OT Education - 07/25/21 0734     Education Details progress with HEP    Person(s) Educated Patient    Methods Explanation;Demonstration;Tactile cues;Verbal cues;Handout    Comprehension Returned demonstration;Verbal cues required;Verbalized understanding                 OT Long Term Goals - 07/19/21 1838       OT LONG TERM GOAL #1   Title Pt pain and tenderness decrease for pt to grip and weight bear without increase symptoms    Baseline Tendernes proximal scar and over swelling at scar - discomfort wiht weight bearing - yoga    Time 3    Period Weeks    Status New    Target Date 08/09/21      OT LONG TERM GOAL #2   Title Pt to show composite flexion and extention of 3rd digit without increase stiffness and tightness to return to her yoga, spinning and working out    Baseline 85 MC and 95 PIP of 3rd and 2nd - tightness  per pt - tendon tight proximal of scar in palm    Time 4    Period Weeks    Status New    Target Date 08/16/21      OT LONG TERM GOAL #3   Title Grip and prehension strength more than 75% compare to L to return to prior level    Baseline NT- 3 wks s/p tomorrow    Time 8    Period Weeks    Status New    Target Date 09/13/21                   Plan - 07/25/21 0734     Clinical Impression Statement Pt is just under 4 wks s/p L 3rd digit trigger finger release - surgery was done 06/29/21 - was on antibiotics.  Pt is R hand dominant. Pt present with edema over scar , tenderness over proximal scar and tightness in end range  for composite fisting and extention  - show some tightness and ? scar adhesion into extention  of 3rd and composite extention. Pt also reminded not to constantlyt massage or do ROM to 3rd digit. She is a active 55 yrs old that does workouts, yoga and spinning - as well as activities around the house - including yardwork. Remind for pt to not weight bear or grip objects tight - but to do contrast, scar massage, cica scar pad under isotoner glove, AROM - not tight or forcefull gripping or fisting  2-3 x day and light normal use. Focus this date on proximal scar and soft tissue mobs to palm and forearm. Pt with above mention impairements limiting her functional use of L hand in ADL's and IADL's - pt can benefit from skilled OT services .    OT Occupational Profile and History Problem Focused Assessment - Including review of records relating to presenting problem    Occupational performance deficits (Please refer to evaluation for details): IADL's;ADL's;Play;Leisure;Social Participation    Body Structure / Function / Physical Skills ADL;Decreased knowledge of precautions;Flexibility;Fascial restriction;Scar mobility;ROM;Edema;Strength    Rehab Potential Good    Clinical Decision Making Limited treatment options, no task modification necessary    Comorbidities Affecting  Occupational Performance: None    Modification or Assistance to Complete Evaluation  No modification of tasks or assist necessary to complete eval    OT Frequency 1x / week   1-2 x wks   OT Duration 8 weeks  OT Treatment/Interventions Self-care/ADL training;Fluidtherapy;Paraffin;Ultrasound;Contrast Bath;Scar mobilization;Therapeutic exercise;Manual Therapy;Patient/family education;Passive range of motion    Consulted and Agree with Plan of Care Patient             Patient will benefit from skilled therapeutic intervention in order to improve the following deficits and impairments:   Body Structure / Function / Physical Skills: ADL, Decreased knowledge of precautions, Flexibility, Fascial restriction, Scar mobility, ROM, Edema, Strength       Visit Diagnosis: Scar condition and fibrosis of skin  Localized edema  Stiffness of left hand, not elsewhere classified    Problem List Patient Active Problem List   Diagnosis Date Noted   Menopausal state 12/31/2019    Oletta Cohn, OTR/L,CLT 07/25/2021, 8:13 AM  [facility] [address] [place], [address] Phone: [phone]   Fax:  [phone]  Name: Ashley Hutchinson MRN: [id number] Date of Birth: 12-14-1965

## 2021-07-28 ENCOUNTER — Ambulatory Visit: Payer: No Typology Code available for payment source | Admitting: Occupational Therapy

## 2021-07-28 DIAGNOSIS — M25642 Stiffness of left hand, not elsewhere classified: Secondary | ICD-10-CM

## 2021-07-28 DIAGNOSIS — L905 Scar conditions and fibrosis of skin: Secondary | ICD-10-CM | POA: Diagnosis not present

## 2021-07-28 DIAGNOSIS — R600 Localized edema: Secondary | ICD-10-CM

## 2021-07-28 NOTE — Therapy (Signed)
[facility] [address] [place], [address] Phone: [phone]   Fax:  [phone]  Occupational Therapy Treatment  Patient Details  Name: Irean Kendricks MRN: [id number] Date of Birth: 03/24/1966 Referring Provider (OT): Dr Rosita Kea   Encounter Date: 07/28/2021   OT End of Session - 07/28/21 0749     Visit Number 3    Number of Visits 8    Date for OT Re-Evaluation 09/13/21    OT Start Time 0730    OT Stop Time 0808    OT Time Calculation (min) 38 min    Activity Tolerance Patient tolerated treatment well    Behavior During Therapy [facility] for tasks assessed/performed             Past Medical History:  Diagnosis Date   Hypoglycemia     Past Surgical History:  Procedure Laterality Date   BASAL CELL CARCINOMA EXCISION     LIGAMENT REPAIR     SQUAMOUS CELL CARCINOMA EXCISION     WISDOM TOOTH EXTRACTION      There were no vitals filed for this visit.   Subjective Assessment - 07/28/21 0731     Subjective  Yesterday had some pain - it was cold - - tuesday I hiked - but I drove about 2 hrs-    Pertinent History Gabriell Daigneault is a 55 y.o. female post left long trigger finger release preformed on 06/29/2021. She started having swelling 2 weeks and there was concern for infection. [name], [place] saw her few days ago, and she also had Dr. Dorthula Nettles look at with ultrasound, which did not show any fluid accumulation. She was placed on cephalexin. She had a large swollen area at the level of the prior surgery with some swelling proximally along the flexor tendon sheath, but again without fluid collection noted with ultrasound evaluation. She has not had drainage. She comes back today for recheck.     The patient locates her pain to the proximal aspect of her left long finger. She states her left long finger was catching a little bit this morning. She states she sleeps in her arthritis compression gloves to reduce  the swelling.  pt refer last week by ortho to OT    Patient Stated Goals I want my hand better so I can do my workouts, spinning, power yoga , hiking and work around the house without issues    Currently in Pain? Yes               Pt has normal AROM in 3rd digit- just stiff this am - MC 90, PIP 100 and wrist extention and digit extention WNL            OT Treatments/Exercises (OP) - 07/28/21 0001       LUE Paraffin   Number Minutes Paraffin 8 Minutes    LUE Paraffin Location Hand    Comments prior to soft tissue                Focus on scar massage this date - proximal scar using mini massager and xtractor -with extnetion of digits and composite wrist and digits  And with extention of digits and wrist afterwards and during extractor and manual massage by OT Pt to do light massage at home and digits extention over roller- lightly Tendon glides - but reinforce AROM not forceful- pt to do scar massage proximal to scar  and scar - but light no  pushing to hard   Pt to cont with isontoner glove and cica scar pad night time  Use some voltaren or essential oil to decrease inflammation  Avoid sustained grip - remind pt she is 4 wks         OT Education - 07/28/21 0749     Education Details progress with HEP    Person(s) Educated Patient    Methods Explanation;Demonstration;Tactile cues;Verbal cues;Handout    Comprehension Returned demonstration;Verbal cues required;Verbalized understanding                 OT Long Term Goals - 07/19/21 1838       OT LONG TERM GOAL #1   Title Pt pain and tenderness decrease for pt to grip and weight bear without increase symptoms    Baseline Tendernes proximal scar and over swelling at scar - discomfort wiht weight bearing - yoga    Time 3    Period Weeks    Status New    Target Date 08/09/21      OT LONG TERM GOAL #2   Title Pt to show composite flexion and extention of 3rd digit without increase stiffness and  tightness to return to her yoga, spinning and working out    Baseline 85 MC and 95 PIP of 3rd and 2nd - tightness per pt - tendon tight proximal of scar in palm    Time 4    Period Weeks    Status New    Target Date 08/16/21      OT LONG TERM GOAL #3   Title Grip and prehension strength more than 75% compare to L to return to prior level    Baseline NT- 3 wks s/p tomorrow    Time 8    Period Weeks    Status New    Target Date 09/13/21                   Plan - 07/28/21 0749     Clinical Impression Statement Pt is 4 wks  and 1 day s/p L 3rd digit trigger finger release - surgery was done 06/29/21 - was on antibiotics.  Pt is R hand dominant. Pt present with edema over scar , tenderness over proximal scar and tightness in end range  for composite fisting and extention  - show some tightness and ? scar adhesion into extention  of 3rd and composite extention. Pt also reminded not to constantlyt massage or do ROM to 3rd digit. She is a active 55 yrs old that does workouts, yoga and spinning - as well as activities around the house - including yardwork. Remind for pt to not weight bear or grip objects tight - but change for her to only do heat , cont  scar massage, cica scar pad under isotoner glove night time, AROM - and look into using Voltaren ointment or essential oil for inflammation- not tight or forcefull gripping or fisting  - and only do HEP 2 x day and light normal use. Focus this date on proximal scar and soft tissue mobs to palm and forearm. Pt with above mention impairements limiting her functional use of L hand in ADL's and IADL's - pt can benefit from skilled OT services .    OT Occupational Profile and History Problem Focused Assessment - Including review of records relating to presenting problem    Occupational performance deficits (Please refer to evaluation for details): IADL's;ADL's;Play;Leisure;Social Participation    Body Structure / Function / Physical Skills  ADL;Decreased knowledge of precautions;Flexibility;Fascial restriction;Scar mobility;ROM;Edema;Strength    Rehab Potential Good    Clinical Decision Making Limited treatment options, no task modification necessary    Comorbidities Affecting Occupational Performance: None    Modification or Assistance to Complete Evaluation  No modification of tasks or assist necessary to complete eval    OT Frequency 1x / week    OT Duration 8 weeks    OT Treatment/Interventions Self-care/ADL training;Fluidtherapy;Paraffin;Ultrasound;Contrast Bath;Scar mobilization;Therapeutic exercise;Manual Therapy;Patient/family education;Passive range of motion    Consulted and Agree with Plan of Care Patient             Patient will benefit from skilled therapeutic intervention in order to improve the following deficits and impairments:   Body Structure / Function / Physical Skills: ADL, Decreased knowledge of precautions, Flexibility, Fascial restriction, Scar mobility, ROM, Edema, Strength       Visit Diagnosis: Scar condition and fibrosis of skin  Localized edema  Stiffness of left hand, not elsewhere classified    Problem List Patient Active Problem List   Diagnosis Date Noted   Menopausal state 12/31/2019    Oletta Cohn, OTR/L,CLT 07/28/2021, 8:13 AM  [facility] [address], [place], [address] Phone: [phone]   Fax:  [phone]  Name: Beza Steppe MRN: [id number] Date of Birth: 10-19-1965

## 2021-08-01 ENCOUNTER — Ambulatory Visit: Payer: No Typology Code available for payment source | Admitting: Occupational Therapy

## 2021-08-01 DIAGNOSIS — R600 Localized edema: Secondary | ICD-10-CM

## 2021-08-01 DIAGNOSIS — L905 Scar conditions and fibrosis of skin: Secondary | ICD-10-CM

## 2021-08-01 DIAGNOSIS — M25642 Stiffness of left hand, not elsewhere classified: Secondary | ICD-10-CM

## 2021-08-01 NOTE — Therapy (Signed)
[facility] [address], [place], [address] Phone: [phone]   Fax:  [phone]  Occupational Therapy Treatment  Patient Details  Name: Ashley Hutchinson MRN: [id number] Date of Birth: [birth year] Referring Provider (OT): Ashley Hutchinson   Encounter Date: 08/01/2021   OT End of Session - 08/01/21 1031     Visit Number 4    Number of Visits 8    Date for OT Re-Evaluation 09/13/21    OT Start Time 0949    OT Stop Time 1027    OT Time Calculation (min) 38 min    Activity Tolerance Patient tolerated treatment well    Behavior During Therapy [facility] for tasks assessed/performed             Past Medical History:  Diagnosis Date   Hypoglycemia     Past Surgical History:  Procedure Laterality Date   BASAL CELL CARCINOMA EXCISION     LIGAMENT REPAIR     SQUAMOUS CELL CARCINOMA EXCISION     WISDOM TOOTH EXTRACTION      There were no vitals filed for this visit.   Subjective Assessment - 08/01/21 1030     Subjective  The swelling is maybe little better- I used the Voltaren and not as sore as it was - but still favoring it -but still that knot and scar tissue - look at that tendon in my palm    Pertinent History Ashley Hutchinson is a 55 y.o. female post left long trigger finger release preformed on 06/29/2021. She started having swelling 2 weeks and there was concern for infection. [place] Hutchinson, [place] saw her few days ago, and she also had Ashley. Dorthula Hutchinson look at with ultrasound, which did not show any fluid accumulation. She was placed on cephalexin. She had a large swollen area at the level of the prior surgery with some swelling proximally along the flexor tendon sheath, but again without fluid collection noted with ultrasound evaluation. She has not had drainage. She comes back today for recheck.     The patient locates her pain to the proximal aspect of her left long finger. She states her left long finger was catching a  little bit this morning. She states she sleeps in her arthritis compression gloves to reduce the swelling.  pt refer last week by ortho to OT    Patient Stated Goals I want my hand better so I can do my workouts, spinning, power yoga , hiking and work around the house without issues                          OT Treatments/Exercises (OP) - 08/01/21 0001       Ultrasound   Ultrasound Location volar palm scar    Ultrasound Parameters 3. 20 %, 0.8 intensity 4 min    Ultrasound Goals Edema      LUE Fluidotherapy   Number Minutes Fluidotherapy 5 Minutes    LUE Fluidotherapy Location Hand;Wrist    Comments AROM composite flexion , extention             Focus on scar massage this date - proximal scar using mini massager and xtractor -with extnetion of digits and composite wrist and digits   And with extention of digits and wrist afterwards and during extractor and manual massage by OT Pt to do scar massage at home with digits and wrist in composite extention  Digits extention over roller-  lightly Tendon glides - but reinforce AROM not forceful- pt to do scar massage proximal to scar  and scar - only 2 x day   Pt to cont with isontoner glove and cica scar pad night time  only Use some voltaren or essential oil to decrease inflammation - done with some success Avoid sustained grip  or tight grip - remind pt she is 5 wks        OT Education - 08/01/21 1031     Education Details progress with HEP    Person(s) Educated Patient    Methods Explanation;Demonstration;Tactile cues;Verbal cues;Handout    Comprehension Returned demonstration;Verbal cues required;Verbalized understanding                 OT Long Term Goals - 07/19/21 1838       OT LONG TERM GOAL #1   Title Pt pain and tenderness decrease for pt to grip and weight bear without increase symptoms    Baseline Tendernes proximal scar and over swelling at scar - discomfort wiht weight bearing - yoga     Time 3    Period Weeks    Status New    Target Date 08/09/21      OT LONG TERM GOAL #2   Title Pt to show composite flexion and extention of 3rd digit without increase stiffness and tightness to return to her yoga, spinning and working out    Baseline 85 MC and 95 PIP of 3rd and 2nd - tightness per pt - tendon tight proximal of scar in palm    Time 4    Period Weeks    Status New    Target Date 08/16/21      OT LONG TERM GOAL #3   Title Grip and prehension strength more than 75% compare to L to return to prior level    Baseline NT- 3 wks s/p tomorrow    Time 8    Period Weeks    Status New    Target Date 09/13/21                   Plan - 08/01/21 1031     Clinical Impression Statement Pt is 5 wks s/p L 3rd digit trigger finger release - surgery was done 06/29/21 - was on antibiotics.  Pt is R hand dominant. Pt  cont to have some edema over scar , tenderness over proximal scar with manual massage with flexor tendon in extention.  Fisting with no pain now but little stiffness in end range. Focus today and pt at home to focus scar massage to  scar adhesion proximal to incision with 3rd and  wrist in composite extention. Pt  to cont not to constantlyt massage or do ROM to 3rd digit. She is a active 55 yrs old that does workouts, yoga and spinning - as well as activities around the house - including yardwork. Pt to do some heat , ROM and massage into am and then try using hand normally during day- if increase pain - hold off and try activity again  - Isotoner glove night time only- cont with Voltaren ointment or essential oil for inflammation- not tight or forcefull gripping or fisting  - and only do HEP 2 x day and light normal use. Focus this date on proximal scar with composite extention  and soft tissue mobs to palm and forearm. Pt with above mention impairements limiting her functional use of L hand in ADL's and IADL's - pt can  benefit from skilled OT services .    OT  Occupational Profile and History Problem Focused Assessment - Including review of records relating to presenting problem    Occupational performance deficits (Please refer to evaluation for details): IADL's;ADL's;Play;Leisure;Social Participation    Body Structure / Function / Physical Skills ADL;Decreased knowledge of precautions;Flexibility;Fascial restriction;Scar mobility;ROM;Edema;Strength    Rehab Potential Good    Clinical Decision Making Limited treatment options, no task modification necessary    Comorbidities Affecting Occupational Performance: None    Modification or Assistance to Complete Evaluation  No modification of tasks or assist necessary to complete eval    OT Frequency 1x / week    OT Duration 8 weeks    OT Treatment/Interventions Self-care/ADL training;Fluidtherapy;Paraffin;Ultrasound;Contrast Bath;Scar mobilization;Therapeutic exercise;Manual Therapy;Patient/family education;Passive range of motion    Consulted and Agree with Plan of Care Patient             Patient will benefit from skilled therapeutic intervention in order to improve the following deficits and impairments:   Body Structure / Function / Physical Skills: ADL, Decreased knowledge of precautions, Flexibility, Fascial restriction, Scar mobility, ROM, Edema, Strength       Visit Diagnosis: Scar condition and fibrosis of skin  Localized edema  Stiffness of left hand, not elsewhere classified    Problem List Patient Active Problem List   Diagnosis Date Noted   Menopausal state 12/31/2019    Oletta Cohn, OTR/L,CLT 08/01/2021, 10:38 AM  [facility] [address] [place], [address] Phone: [phone]   Fax:  [phone]  Name: Ashley Hutchinson MRN: [id number] Date of Birth: [birth year]

## 2021-08-04 ENCOUNTER — Ambulatory Visit: Payer: No Typology Code available for payment source | Admitting: Occupational Therapy

## 2021-08-04 DIAGNOSIS — M25642 Stiffness of left hand, not elsewhere classified: Secondary | ICD-10-CM

## 2021-08-04 DIAGNOSIS — L905 Scar conditions and fibrosis of skin: Secondary | ICD-10-CM

## 2021-08-04 DIAGNOSIS — R600 Localized edema: Secondary | ICD-10-CM

## 2021-08-04 NOTE — Therapy (Signed)
[facility] [address] [place], [address] Phone: [phone]   Fax:  [phone]  Occupational Therapy Treatment  Patient Details  Name: Ashley Hutchinson MRN: [id number] Date of Birth: 02-18-1966 Referring Provider (OT): Dr Rosita Kea   Encounter Date: 08/04/2021   OT End of Session - 08/04/21 0809     Visit Number 5    Number of Visits 8    Date for OT Re-Evaluation 09/13/21    OT Start Time 0735    OT Stop Time 0805    OT Time Calculation (min) 30 min    Activity Tolerance Patient tolerated treatment well    Behavior During Therapy [facility] for tasks assessed/performed             Past Medical History:  Diagnosis Date   Hypoglycemia     Past Surgical History:  Procedure Laterality Date   BASAL CELL CARCINOMA EXCISION     LIGAMENT REPAIR     SQUAMOUS CELL CARCINOMA EXCISION     WISDOM TOOTH EXTRACTION      There were no vitals filed for this visit.   Subjective Assessment - 08/04/21 0807     Subjective  Just stiff in the morning - but during day better but the scar tissue and swelling still there and that tendon popping up in my palm - try to  use it more normal    Pertinent History Ashley Hutchinson is a 55 y.o. female post left long trigger finger release preformed on 06/29/2021. She started having swelling 2 weeks and there was concern for infection. [name], [place] saw her few days ago, and she also had Dr. Dorthula Nettles look at with ultrasound, which did not show any fluid accumulation. She was placed on cephalexin. She had a large swollen area at the level of the prior surgery with some swelling proximally along the flexor tendon sheath, but again without fluid collection noted with ultrasound evaluation. She has not had drainage. She comes back today for recheck.     The patient locates her pain to the proximal aspect of her left long finger. She states her left long finger was catching a little bit this  morning. She states she sleeps in her arthritis compression gloves to reduce the swelling.  pt refer last week by ortho to OT    Patient Stated Goals I want my hand better so I can do my workouts, spinning, power yoga , hiking and work around the house without issues    Currently in Pain? No/denies                [facility] OT Assessment - 08/04/21 0001       Left Hand AROM   L Index  MCP 0-90 90 Degrees    L Index PIP 0-100 100 Degrees    L Long  MCP 0-90 95 Degrees    L Long PIP 0-100 95 Degrees                      OT Treatments/Exercises (OP) - 08/04/21 0001       Moist Heat Therapy   Number Minutes Moist Heat 6 Minutes    Moist Heat Location Hand   prior to soft tissue              Focus on scar massage this date  again- proximal scar using mini massager and xtractor -with extnetion of digits and composite wrist and digits  extention   Pt to do scar massage 2 x day - do heat prior - especially in the morning Digits extention over roller- lightly Tendon glides - but reinforce AROM not forceful- min A for correct motion - intrinsic fist Pt to cont with isontoner glove and cica scar pad night time  only Use some voltaren or essential oil to decrease inflammation - done with some success Avoid sustained grip  or tight grip - and keep pain free  But try use normally - pt out of town this coming week          OT Education - 08/04/21 0809     Education Details progress with HEP    Person(s) Educated Patient    Methods Explanation;Demonstration;Tactile cues;Verbal cues;Handout    Comprehension Returned demonstration;Verbal cues required;Verbalized understanding                 OT Long Term Goals - 07/19/21 1838       OT LONG TERM GOAL #1   Title Pt pain and tenderness decrease for pt to grip and weight bear without increase symptoms    Baseline Tendernes proximal scar and over swelling at scar - discomfort wiht weight bearing - yoga    Time  3    Period Weeks    Status New    Target Date 08/09/21      OT LONG TERM GOAL #2   Title Pt to show composite flexion and extention of 3rd digit without increase stiffness and tightness to return to her yoga, spinning and working out    Baseline 85 MC and 95 PIP of 3rd and 2nd - tightness per pt - tendon tight proximal of scar in palm    Time 4    Period Weeks    Status New    Target Date 08/16/21      OT LONG TERM GOAL #3   Title Grip and prehension strength more than 75% compare to L to return to prior level    Baseline NT- 3 wks s/p tomorrow    Time 8    Period Weeks    Status New    Target Date 09/13/21                   Plan - 08/04/21 0809     Clinical Impression Statement Pt is 5 1/2 wks s/p L 3rd digit trigger finger release - surgery was done 06/29/21 - was on antibiotics.  Pt is R hand dominant. Pt  cont to have some edema over scar , tenderness over proximal scar with manual massage with flexor tendon in extention.  Fisting WNL and no pain- extention WNL -but pullin the palm with composite extention like prayer stretch - mostly stiffness in the morning - loosen up during day - and less pain with use.  She is a active 55 yrs old that does workouts, yoga and spinning - as well as activities around the house - including yardwork. Pt to cont with some heat , ROM and massage in the am and then try using hand normally during day- if increase pain - hold off and try activity again  - Isotoner glove night time only- cont with Voltaren ointment or essential oil for inflammation-  Pt  in [place] this coming week - remind to not overdo her scar massage. Focus this date again on proximal scar with composite extention  and soft tissue mobs to palm and forearm. Pt with above mention impairements limiting her  functional use of L hand in ADL's and IADL's - pt can benefit from skilled OT services .    OT Occupational Profile and History Problem Focused Assessment - Including review of  records relating to presenting problem    Occupational performance deficits (Please refer to evaluation for details): IADL's;ADL's;Play;Leisure;Social Participation    Body Structure / Function / Physical Skills ADL;Decreased knowledge of precautions;Flexibility;Fascial restriction;Scar mobility;ROM;Edema;Strength    Rehab Potential Good    Clinical Decision Making Limited treatment options, no task modification necessary    Comorbidities Affecting Occupational Performance: None    Modification or Assistance to Complete Evaluation  No modification of tasks or assist necessary to complete eval    OT Frequency 1x / week    OT Duration 8 weeks    OT Treatment/Interventions Self-care/ADL training;Fluidtherapy;Paraffin;Ultrasound;Contrast Bath;Scar mobilization;Therapeutic exercise;Manual Therapy;Patient/family education;Passive range of motion    Consulted and Agree with Plan of Care Patient             Patient will benefit from skilled therapeutic intervention in order to improve the following deficits and impairments:   Body Structure / Function / Physical Skills: ADL, Decreased knowledge of precautions, Flexibility, Fascial restriction, Scar mobility, ROM, Edema, Strength       Visit Diagnosis: Scar condition and fibrosis of skin  Localized edema  Stiffness of left hand, not elsewhere classified    Problem List Patient Active Problem List   Diagnosis Date Noted   Menopausal state 12/31/2019    Oletta Cohn, OTR/L,CLT 08/04/2021, 8:14 AM  [facility] [address] [place], [address] Phone: [phone]   Fax:  [phone]  Name: Shelanda Duvall MRN: [id number] Date of Birth: 1966/04/25

## 2021-08-15 ENCOUNTER — Encounter: Payer: No Typology Code available for payment source | Admitting: Occupational Therapy

## 2021-08-18 ENCOUNTER — Other Ambulatory Visit: Payer: Self-pay | Admitting: Orthopedic Surgery

## 2021-08-25 ENCOUNTER — Encounter
Admission: RE | Admit: 2021-08-25 | Discharge: 2021-08-25 | Disposition: A | Discharge status: Home / Self Care | Payer: Self-pay | Source: Ambulatory Visit | Attending: Orthopedic Surgery | Admitting: Orthopedic Surgery

## 2021-08-25 ENCOUNTER — Other Ambulatory Visit: Payer: Self-pay

## 2021-08-25 HISTORY — DX: Unspecified osteoarthritis, unspecified site: M19.90

## 2021-08-25 HISTORY — DX: Malignant (primary) neoplasm, unspecified: C80.1

## 2021-08-25 NOTE — Patient Instructions (Addendum)
Your procedure is scheduled on: Tuesday 08/30/21 Report to the Registration Desk on the 1st floor of the Medical Mall. To find out your arrival time, please call [phone] between 1PM - 3PM on: Monday 08/29/21  REMEMBER: Instructions that are not followed completely may result in serious medical risk, up to and including death; or upon the discretion of your surgeon and anesthesiologist your surgery may need to be rescheduled.  Do not eat food after midnight the night before surgery.  No gum chewing, lozengers or hard candies.  You may however, drink CLEAR liquids up to 2 hours before you are scheduled to arrive for your surgery. Do not drink anything within 2 hours of your scheduled arrival time.  Clear liquids include: - water  - apple juice without pulp - gatorade (not RED, PURPLE, OR BLUE) - black coffee or tea (Do NOT add milk or creamers to the coffee or tea) Do NOT drink anything that is not on this list.  TAKE THESE MEDICATIONS THE MORNING OF SURGERY WITH A SIP OF WATER: None  One week prior to surgery: Stop Anti-inflammatories (NSAIDS) such as Advil, Aleve, Ibuprofen, Motrin, Naproxen, Naprosyn and Aspirin based products such as Excedrin, Goodys Powder, BC Powder. Stop your glucosamine-chondroitin 500-400 MG tablet, Multiple Vitamin (MULTIVITAMIN) tablet, and Omega-3 Fatty Acids (OMEGA 3 PO)ANY OVER THE COUNTER supplements until after surgery. You may however, continue to take Tylenol if needed for pain up until the day of surgery.  No Alcohol for 24 hours before or after surgery.  No Smoking including e-cigarettes for 24 hours prior to surgery.  No chewable tobacco products for at least 6 hours prior to surgery.  No nicotine patches on the day of surgery.  Do not use any "recreational" drugs for at least a week prior to your surgery.  Please be advised that the combination of cocaine and anesthesia may have negative outcomes, up to and including death. If you test  positive for cocaine, your surgery will be cancelled.  On the morning of surgery brush your teeth with toothpaste and water, you may rinse your mouth with mouthwash if you wish. Do not swallow any toothpaste or mouthwash.  Do not wear jewelry, make-up, hairpins, clips or nail polish.  Do not wear lotions, powders, or perfumes.   Do not shave body from the neck down 48 hours prior to surgery just in case you cut yourself which could leave a site for infection.   Do not bring valuables to the hospital. [facility] is not responsible for any missing/lost belongings or valuables.   Notify your doctor if there is any change in your medical condition (cold, fever, infection).  Wear comfortable clothing (specific to your surgery type) to the hospital.  After surgery, you can help prevent lung complications by doing breathing exercises.  Take deep breaths and cough every 1-2 hours.  If you are being discharged the day of surgery, you will not be allowed to drive home. You will need a responsible adult (18 years or older) to drive you home and stay with you that night.   If you are taking public transportation, you will need to have a responsible adult (18 years or older) with you. Please confirm with your physician that it is acceptable to use public transportation.   Please call the Pre-admissions Testing Dept. at [phone] if you have any questions about these instructions.  Surgery Visitation Policy:  Patients undergoing a surgery or procedure may have one family member or support  person with them as long as that person is not COVID-19 positive or experiencing its symptoms.  That person may remain in the waiting area during the procedure and may rotate out with other people.  Inpatient Visitation:    Visiting hours are 7 a.m. to 8 p.m. Up to two visitors ages 16+ are allowed at one time in a patient room. The visitors may rotate out with other people during the day. Visitors must  check out when they leave, or other visitors will not be allowed. One designated support person may remain overnight. The visitor must pass COVID-19 screenings, use hand sanitizer when entering and exiting the patient's room and wear a mask at all times, including in the patient's room. Patients must also wear a mask when staff or their visitor are in the room. Masking is required regardless of vaccination status.

## 2021-08-30 ENCOUNTER — Encounter: Admission: RE | Payer: Self-pay | Source: Home / Self Care

## 2021-08-30 ENCOUNTER — Ambulatory Visit
Admission: RE | Admit: 2021-08-30 | Payer: No Typology Code available for payment source | Source: Home / Self Care | Admitting: Orthopedic Surgery

## 2021-08-30 SURGERY — RELEASE, A1 PULLEY, FOR TRIGGER FINGER
Anesthesia: Choice | Laterality: Left

## 2021-09-13 ENCOUNTER — Other Ambulatory Visit: Payer: Self-pay | Admitting: Internal Medicine

## 2021-09-13 DIAGNOSIS — Z1231 Encounter for screening mammogram for malignant neoplasm of breast: Secondary | ICD-10-CM

## 2021-10-25 ENCOUNTER — Other Ambulatory Visit: Payer: Self-pay

## 2021-10-25 ENCOUNTER — Ambulatory Visit
Admission: RE | Admit: 2021-10-25 | Discharge: 2021-10-25 | Disposition: A | Discharge status: Home / Self Care | Payer: 59 | Source: Ambulatory Visit | Attending: Internal Medicine | Admitting: Internal Medicine

## 2021-10-25 DIAGNOSIS — Z1231 Encounter for screening mammogram for malignant neoplasm of breast: Secondary | ICD-10-CM | POA: Insufficient documentation

## 2021-12-22 DIAGNOSIS — M25532 Pain in left wrist: Secondary | ICD-10-CM

## 2021-12-22 DIAGNOSIS — M79645 Pain in left finger(s): Secondary | ICD-10-CM | POA: Insufficient documentation

## 2021-12-22 HISTORY — DX: Pain in left wrist: M25.532

## 2021-12-22 HISTORY — DX: Pain in left finger(s): M79.645

## 2021-12-24 DIAGNOSIS — M25839 Other specified joint disorders, unspecified wrist: Secondary | ICD-10-CM | POA: Insufficient documentation

## 2021-12-24 DIAGNOSIS — D219 Benign neoplasm of connective and other soft tissue, unspecified: Secondary | ICD-10-CM

## 2021-12-24 HISTORY — DX: Other specified joint disorders, unspecified wrist: M25.839

## 2021-12-24 HISTORY — DX: Benign neoplasm of connective and other soft tissue, unspecified: D21.9

## 2022-10-16 ENCOUNTER — Other Ambulatory Visit: Payer: Self-pay | Admitting: Nurse Practitioner

## 2022-10-16 DIAGNOSIS — Z1231 Encounter for screening mammogram for malignant neoplasm of breast: Secondary | ICD-10-CM

## 2022-10-18 DIAGNOSIS — M67432 Ganglion, left wrist: Secondary | ICD-10-CM

## 2022-10-18 HISTORY — DX: Ganglion, left wrist: M67.432

## 2022-11-02 ENCOUNTER — Ambulatory Visit
Admission: RE | Admit: 2022-11-02 | Discharge: 2022-11-02 | Disposition: A | Discharge status: Home / Self Care | Payer: 59 | Source: Ambulatory Visit | Attending: Nurse Practitioner | Admitting: Nurse Practitioner

## 2022-11-02 DIAGNOSIS — Z1231 Encounter for screening mammogram for malignant neoplasm of breast: Secondary | ICD-10-CM | POA: Insufficient documentation

## 2022-12-20 DIAGNOSIS — M25632 Stiffness of left wrist, not elsewhere classified: Secondary | ICD-10-CM | POA: Insufficient documentation

## 2022-12-20 HISTORY — DX: Stiffness of left wrist, not elsewhere classified: M25.632

## 2023-01-22 ENCOUNTER — Other Ambulatory Visit: Payer: Self-pay | Admitting: Family

## 2023-04-24 ENCOUNTER — Encounter: Payer: Self-pay | Admitting: Internal Medicine

## 2023-04-26 ENCOUNTER — Other Ambulatory Visit: Payer: 59

## 2023-04-26 ENCOUNTER — Other Ambulatory Visit: Payer: Self-pay | Admitting: Family

## 2023-04-26 DIAGNOSIS — I10 Essential (primary) hypertension: Secondary | ICD-10-CM

## 2023-04-26 DIAGNOSIS — E538 Deficiency of other specified B group vitamins: Secondary | ICD-10-CM

## 2023-04-26 DIAGNOSIS — E559 Vitamin D deficiency, unspecified: Secondary | ICD-10-CM

## 2023-04-26 DIAGNOSIS — E039 Hypothyroidism, unspecified: Secondary | ICD-10-CM

## 2023-04-26 DIAGNOSIS — R5383 Other fatigue: Secondary | ICD-10-CM

## 2023-04-26 DIAGNOSIS — R7303 Prediabetes: Secondary | ICD-10-CM

## 2023-04-26 DIAGNOSIS — E782 Mixed hyperlipidemia: Secondary | ICD-10-CM

## 2023-04-26 DIAGNOSIS — N951 Menopausal and female climacteric states: Secondary | ICD-10-CM

## 2023-04-26 NOTE — Progress Notes (Signed)
sst

## 2023-04-28 LAB — CBC WITH DIFFERENTIAL
Basophils Absolute: 0.1 10*3/uL (ref 0.0–0.2)
Basos: 1 %
EOS (ABSOLUTE): 0.3 10*3/uL (ref 0.0–0.4)
Eos: 4 %
Hematocrit: 40.1 % (ref 34.0–46.6)
Hemoglobin: 13.4 g/dL (ref 11.1–15.9)
Immature Grans (Abs): 0 10*3/uL (ref 0.0–0.1)
Immature Granulocytes: 0 %
Lymphocytes Absolute: 2.5 10*3/uL (ref 0.7–3.1)
Lymphs: 40 %
MCH: 30.9 pg (ref 26.6–33.0)
MCHC: 33.4 g/dL (ref 31.5–35.7)
MCV: 92 fL (ref 79–97)
Monocytes Absolute: 0.3 10*3/uL (ref 0.1–0.9)
Monocytes: 5 %
Neutrophils Absolute: 3 10*3/uL (ref 1.4–7.0)
Neutrophils: 50 %
RBC: 4.34 x10E6/uL (ref 3.77–5.28)
RDW: 12.8 % (ref 11.7–15.4)
WBC: 6.2 10*3/uL (ref 3.4–10.8)

## 2023-04-28 LAB — CMP14+EGFR
ALT: 25 IU/L (ref 0–32)
AST: 28 IU/L (ref 0–40)
Albumin: 4.5 g/dL (ref 3.8–4.9)
Alkaline Phosphatase: 60 IU/L (ref 44–121)
BUN/Creatinine Ratio: 21 (ref 9–23)
BUN: 19 mg/dL (ref 6–24)
Bilirubin Total: 0.7 mg/dL (ref 0.0–1.2)
CO2: 23 mmol/L (ref 20–29)
Calcium: 9.5 mg/dL (ref 8.7–10.2)
Chloride: 100 mmol/L (ref 96–106)
Creatinine, Ser: 0.92 mg/dL (ref 0.57–1.00)
Globulin, Total: 1.8 g/dL (ref 1.5–4.5)
Glucose: 97 mg/dL (ref 70–99)
Potassium: 4.5 mmol/L (ref 3.5–5.2)
Sodium: 138 mmol/L (ref 134–144)
Total Protein: 6.3 g/dL (ref 6.0–8.5)
eGFR: 73 mL/min/{1.73_m2} (ref >59)

## 2023-04-28 LAB — LIPID PANEL
Chol/HDL Ratio: 2 ratio (ref 0.0–4.4)
Cholesterol, Total: 177 mg/dL (ref 100–199)
HDL: 87 mg/dL (ref >39)
LDL Chol Calc (NIH): 76 mg/dL (ref 0–99)
Triglycerides: 73 mg/dL (ref 0–149)
VLDL Cholesterol Cal: 14 mg/dL (ref 5–40)

## 2023-04-28 LAB — VITAMIN B12: Vitamin B-12: 604 pg/mL (ref 232–1245)

## 2023-04-28 LAB — TESTOSTERONE,FREE AND TOTAL
Testosterone, Free: 1.7 pg/mL (ref 0.0–4.2)
Testosterone: 101 ng/dL — ABNORMAL HIGH (ref 4–50)

## 2023-04-28 LAB — FSH+PROG+E2+SHBG
Estradiol: 13.9 pg/mL
FSH: 139 m[IU]/mL
Progesterone: 1.6 ng/mL
Sex Hormone Binding: 70 nmol/L (ref 17.3–125.0)

## 2023-04-28 LAB — VITAMIN D 25 HYDROXY (VIT D DEFICIENCY, FRACTURES): Vit D, 25-Hydroxy: 57.9 ng/mL (ref 30.0–100.0)

## 2023-04-28 LAB — HEMOGLOBIN A1C
Est. average glucose Bld gHb Est-mCnc: 103 mg/dL
Hgb A1c MFr Bld: 5.2 % (ref 4.8–5.6)

## 2023-04-28 LAB — TSH: TSH: 1.85 u[IU]/mL (ref 0.450–4.500)

## 2023-05-03 ENCOUNTER — Ambulatory Visit: Payer: 59 | Admitting: Family

## 2023-05-03 VITALS — BP 115/79 | HR 61 | Ht 64.0 in | Wt 120.4 lb

## 2023-05-03 DIAGNOSIS — N951 Menopausal and female climacteric states: Secondary | ICD-10-CM

## 2023-05-03 DIAGNOSIS — R5383 Other fatigue: Secondary | ICD-10-CM | POA: Diagnosis not present

## 2023-05-03 DIAGNOSIS — E559 Vitamin D deficiency, unspecified: Secondary | ICD-10-CM | POA: Diagnosis not present

## 2023-05-03 DIAGNOSIS — E538 Deficiency of other specified B group vitamins: Secondary | ICD-10-CM

## 2023-05-03 MED ORDER — TRIAMCINOLONE ACETONIDE 0.1 % EX CREA: 1.0000 | TOPICAL_CREAM | Freq: Two times a day (BID) | CUTANEOUS | 0 refills | Status: DC

## 2023-05-03 MED ORDER — AZELAIC ACID 15 % EX GEL: 1.0000 | Freq: Two times a day (BID) | CUTANEOUS | 2 refills | Status: DC

## 2023-05-07 ENCOUNTER — Other Ambulatory Visit: Payer: Self-pay | Admitting: Family

## 2023-05-08 ENCOUNTER — Telehealth: Payer: Self-pay | Admitting: Internal Medicine

## 2023-05-08 NOTE — Telephone Encounter (Signed)
Patient left VM that her testosterone cream was sent as 1% and should have been 2%. Please resend to pharmacy.

## 2023-05-09 ENCOUNTER — Encounter: Payer: Self-pay | Admitting: Family

## 2023-05-09 MED ORDER — TESTOSTERONE MICRONIZED POWD: 5 refills | Status: DC

## 2023-05-09 NOTE — Progress Notes (Signed)
Established Patient Office Visit  Subjective:  Patient ID: Ashley Hutchinson, female    DOB: 11-14-1965  Age: 57 y.o. MRN: [id number]  Chief Complaint  Patient presents with   Follow-up    LAB F/U    Patient is here today for her follow up.  She has been feeling fairly well since last appointment.   She does not have additional concerns to discuss today.  Labs were done last week, will review in detail today. She needs refills.   I have reviewed her active problem list, medication list, allergies, notes from last encounter, lab results for her appointment today.   No other concerns at this time.   Past Medical History:  Diagnosis Date   Acute pain of left wrist 12/22/2021   Anxiety 07/14/2021   Arthritis    both   Cancer (HCC)    basall cell carcinoma   Fibroma 12/24/2021   Ganglion cyst of dorsum of left wrist 10/18/2022   Hypoglycemia    Pain in finger of left hand 12/22/2021   Stiffness of left wrist joint 12/20/2022   Trigger middle finger of left hand 03/01/2020   Vitamin D deficiency 07/14/2021   Wrist impingement syndrome 12/24/2021    Past Surgical History:  Procedure Laterality Date   BASAL CELL CARCINOMA EXCISION     BLEPHAROPLASTY  11/23/2000   lower eyelid   COLONOSCOPY  09/12/2016   DERMABRASION N/A 11/23/2020   face   LIGAMENT REPAIR     SQUAMOUS CELL CARCINOMA EXCISION     WISDOM TOOTH EXTRACTION      Social History   Socioeconomic History   Marital status: Married    Spouse name: Not on file   Number of children: Not on file   Years of education: Not on file   Highest education level: Not on file  Occupational History   Not on file  Tobacco Use   Smoking status: Never   Smokeless tobacco: Never  Vaping Use   Vaping status: Never Used  Substance and Sexual Activity   Alcohol use: No   Drug use: No   Sexual activity: Yes  Other Topics Concern   Not on file  Social History Narrative   Not on file   Social Determinants of Health    Financial Resource Strain: Not on file  Food Insecurity: Not on file  Transportation Needs: Not on file  Physical Activity: Not on file  Stress: Not on file  Social Connections: Not on file  Intimate Partner Violence: Not on file    Family History  Problem Relation Age of Onset   Cancer Father        lung   Hypertension Mother    Breast cancer Neg Hx     Allergies  Allergen Reactions   Sulfa Antibiotics Hives    Review of Systems  All other systems reviewed and are negative.      Objective:   BP 115/79   Pulse 61   Ht 5\' 4"  (1.626 m)   Wt 120 lb 6.4 oz (54.6 kg)   LMP 07/24/2018   SpO2 98%   BMI 20.67 kg/m   Vitals:   05/03/23 1405  BP: 115/79  Pulse: 61  Height: 5\' 4"  (1.626 m)  Weight: 120 lb 6.4 oz (54.6 kg)  SpO2: 98%  BMI (Calculated): 20.66    Physical Exam Vitals and nursing note reviewed.  Constitutional:      Appearance: Normal appearance. She is normal weight.  HENT:  Head: Normocephalic.  Eyes:     Pupils: Pupils are equal, round, and reactive to light.  Cardiovascular:     Rate and Rhythm: Normal rate.  Pulmonary:     Effort: Pulmonary effort is normal.  Neurological:     Mental Status: She is alert.      No results found for any visits on 05/03/23.  Recent Results (from the past 2160 hour(s))  Lipid panel     Status: None   Collection Time: 04/26/23  3:18 PM  Result Value Ref Range   Cholesterol, Total 177 100 - 199 mg/dL   Triglycerides 73 0 - 149 mg/dL   HDL 87 >16 mg/dL   VLDL Cholesterol Cal 14 5 - 40 mg/dL   LDL Chol Calc (NIH) 76 0 - 99 mg/dL   Chol/HDL Ratio 2.0 0.0 - 4.4 ratio    Comment:                                   T. Chol/HDL Ratio                                             Men  Women                               1/2 Avg.Risk  3.4    3.3                                   Avg.Risk  5.0    4.4                                2X Avg.Risk  9.6    7.1                                3X Avg.Risk 23.4    11.0   VITAMIN D 25 Hydroxy (Vit-D Deficiency, Fractures)     Status: None   Collection Time: 04/26/23  3:18 PM  Result Value Ref Range   Vit D, 25-Hydroxy 57.9 30.0 - 100.0 ng/mL    Comment: Vitamin D deficiency has been defined by the Institute of Medicine and an Endocrine Society practice guideline as a level of serum 25-OH vitamin D less than 20 ng/mL (1,2). The Endocrine Society went on to further define vitamin D insufficiency as a level between 21 and 29 ng/mL (2). 1. IOM (Institute of Medicine). 2010. Dietary reference    intakes for calcium and D. [place]: The    [employer]. 2. Holick MF, Binkley [place], Bischoff-Ferrari HA, et al.    Evaluation, treatment, and prevention of vitamin D    deficiency: an Endocrine Society clinical practice    guideline. JCEM. 2011 Jul; 96(7):1911-30.   CBC With Differential     Status: None   Collection Time: 04/26/23  3:18 PM  Result Value Ref Range   WBC 6.2 3.4 - 10.8 x10E3/uL   RBC 4.34 3.77 - 5.28 x10E6/uL   Hemoglobin 13.4 11.1 - 15.9 g/dL   Hematocrit 10.9 60.4 - 46.6 %  MCV 92 79 - 97 fL   MCH 30.9 26.6 - 33.0 pg   MCHC 33.4 31.5 - 35.7 g/dL   RDW 78.2 95.6 - 21.3 %   Neutrophils 50 Not Estab. %   Lymphs 40 Not Estab. %   Monocytes 5 Not Estab. %   Eos 4 Not Estab. %   Basos 1 Not Estab. %   Neutrophils Absolute 3.0 1.4 - 7.0 x10E3/uL   Lymphocytes Absolute 2.5 0.7 - 3.1 x10E3/uL   Monocytes Absolute 0.3 0.1 - 0.9 x10E3/uL   EOS (ABSOLUTE) 0.3 0.0 - 0.4 x10E3/uL   Basophils Absolute 0.1 0.0 - 0.2 x10E3/uL   Immature Granulocytes 0 Not Estab. %   Immature Grans (Abs) 0.0 0.0 - 0.1 x10E3/uL    Comment: **Effective May 07, 2023, profile [id number] CBC/Differential**   (No Platelet) will be made non-orderable. [facility] Offers:   [id number] CBC With Differential/Platelet   CMP14+EGFR     Status: None   Collection Time: 04/26/23  3:18 PM  Result Value Ref Range   Glucose 97 70 - 99 mg/dL   BUN 19 6 - 24 mg/dL    Creatinine, Ser 4.69 0.57 - 1.00 mg/dL   eGFR 73 >62 XB/MWU/1.32   BUN/Creatinine Ratio 21 9 - 23   Sodium 138 134 - 144 mmol/L   Potassium 4.5 3.5 - 5.2 mmol/L   Chloride 100 96 - 106 mmol/L   CO2 23 20 - 29 mmol/L   Calcium 9.5 8.7 - 10.2 mg/dL   Total Protein 6.3 6.0 - 8.5 g/dL   Albumin 4.5 3.8 - 4.9 g/dL   Globulin, Total 1.8 1.5 - 4.5 g/dL   Bilirubin Total 0.7 0.0 - 1.2 mg/dL   Alkaline Phosphatase 60 44 - 121 IU/L   AST 28 0 - 40 IU/L   ALT 25 0 - 32 IU/L  TSH     Status: None   Collection Time: 04/26/23  3:18 PM  Result Value Ref Range   TSH 1.850 0.450 - 4.500 uIU/mL  Hemoglobin A1c     Status: None   Collection Time: 04/26/23  3:18 PM  Result Value Ref Range   Hgb A1c MFr Bld 5.2 4.8 - 5.6 %    Comment:          Prediabetes: 5.7 - 6.4          Diabetes: >6.4          Glycemic control for adults with diabetes: <7.0    Est. average glucose Bld gHb Est-mCnc 103 mg/dL  Vitamin G40     Status: None   Collection Time: 04/26/23  3:18 PM  Result Value Ref Range   Vitamin B-12 604 232 - 1,245 pg/mL  FSH+Prog+E2+SHBG     Status: None   Collection Time: 04/26/23  3:18 PM  Result Value Ref Range   FSH 139.0 mIU/mL    Comment:                      Adult Female             Range                       Follicular phase      3.5 -  12.5                       Ovulation phase       4.7 -  21.5  Luteal phase          1.7 -   7.7                       Postmenopausal       25.8 - 134.8    Progesterone 1.6 ng/mL    Comment:                      Follicular phase       0.1 -   0.9                      Luteal phase           1.8 -  23.9                      Ovulation phase        0.1 -  12.0                      Pregnant                         First trimester    11.0 -  44.3                         Second trimester   25.4 -  83.3                         Third trimester    58.7 - 214.0                      Postmenopausal         0.0 -   0.1    Sex Hormone  Binding 70.0 17.3 - 125.0 nmol/L   Estradiol 13.9 pg/mL    Comment:                      Adult Female             Range                       Follicular phase     12.5 - 166.0                       Ovulation phase      85.8 - 498.0                       Luteal phase         43.8 - 211.0                       Postmenopausal       <6.0 -  54.7                      Pregnancy                       1st trimester     215.0 - >4300.0 Roche ECLIA methodology   Testosterone,Free and Total     Status: Abnormal   Collection Time: 04/26/23  3:18 PM  Result Value Ref Range   Testosterone 101 (H) 4 - 50 ng/dL    Comment: **Verified by  repeat analysis**   Testosterone, Free 1.7 0.0 - 4.2 pg/mL       Assessment & Plan:   Problem List Items Addressed This Visit       Active Problems   Menopausal state - Primary    will send refills for her medications. Hormone levels are stable.  Continue current POC.       Other Visit Diagnoses     B12 deficiency due to diet       B12 level is stable. Continue supplements as needed.   Vitamin D deficiency, unspecified       Vitamin D level is stable Continue supplements as needed.   Other fatigue           Return if symptoms worsen or fail to improve.   Total time spent: 20 minutes  Miki Kins, FNP  05/03/2023   This document may have been prepared by [facility] Voice Recognition software and as such may include unintentional dictation errors.

## 2023-05-09 NOTE — Assessment & Plan Note (Signed)
will send refills for her medications. Hormone levels are stable.  Continue current POC.

## 2023-05-10 NOTE — Telephone Encounter (Signed)
See other task, already done

## 2023-05-18 ENCOUNTER — Other Ambulatory Visit: Payer: Self-pay | Admitting: Family

## 2023-05-18 ENCOUNTER — Encounter: Payer: Self-pay | Admitting: Family

## 2023-05-18 DIAGNOSIS — M79671 Pain in right foot: Secondary | ICD-10-CM

## 2023-08-06 ENCOUNTER — Other Ambulatory Visit: Payer: Self-pay | Admitting: Family

## 2023-09-27 ENCOUNTER — Encounter: Payer: Self-pay | Admitting: Cardiology

## 2023-09-27 ENCOUNTER — Ambulatory Visit: Payer: 59 | Admitting: Cardiology

## 2023-09-27 VITALS — BP 104/68 | HR 64 | Ht 63.0 in | Wt 121.2 lb

## 2023-09-27 DIAGNOSIS — L309 Dermatitis, unspecified: Secondary | ICD-10-CM

## 2023-09-27 DIAGNOSIS — Z0130 Encounter for examination of blood pressure without abnormal findings: Secondary | ICD-10-CM

## 2023-09-27 MED ORDER — TRIAMCINOLONE ACETONIDE 0.1 % EX CREA: 1.0000 | TOPICAL_CREAM | Freq: Two times a day (BID) | CUTANEOUS | 0 refills | Status: AC

## 2023-09-27 NOTE — Progress Notes (Signed)
Established Patient Office Visit  Subjective:  Patient ID: Ashley Hutchinson, female    DOB: 01-15-1966  Age: 57 y.o. MRN: [id number]  Chief Complaint  Patient presents with   Rash    Patient in office for an acute visit. Patient complaining of a rash. Patient states she gets the rash every year, she has been using a cream at home that is not helping. Cream is Tretin X, which is an acne cream. Will refill kenalog cream. Recommend restarting Claritin.   Rash This is a recurrent problem. The current episode started in the past 7 days. The problem has been waxing and waning since onset. The affected locations include the torso and back. The rash is characterized by itchiness, dryness and redness. She was exposed to nothing. Pertinent negatives include no diarrhea, joint pain or shortness of breath. The treatment provided no relief. Her past medical history is significant for allergies.    No other concerns at this time.   Past Medical History:  Diagnosis Date   Acute pain of left wrist 12/22/2021   Anxiety 07/14/2021   Arthritis    both   Cancer (HCC)    basall cell carcinoma   Fibroma 12/24/2021   Ganglion cyst of dorsum of left wrist 10/18/2022   Hypoglycemia    Pain in finger of left hand 12/22/2021   Stiffness of left wrist joint 12/20/2022   Trigger middle finger of left hand 03/01/2020   Vitamin D deficiency 07/14/2021   Wrist impingement syndrome 12/24/2021    Past Surgical History:  Procedure Laterality Date   BASAL CELL CARCINOMA EXCISION     BLEPHAROPLASTY  11/23/2000   lower eyelid   COLONOSCOPY  09/12/2016   DERMABRASION N/A 11/23/2020   face   LIGAMENT REPAIR     SQUAMOUS CELL CARCINOMA EXCISION     WISDOM TOOTH EXTRACTION      Social History   Socioeconomic History   Marital status: Married    Spouse name: Not on file   Number of children: Not on file   Years of education: Not on file   Highest education level: Not on file  Occupational History   Not on  file  Tobacco Use   Smoking status: Never   Smokeless tobacco: Never  Vaping Use   Vaping status: Never Used  Substance and Sexual Activity   Alcohol use: No   Drug use: No   Sexual activity: Yes  Other Topics Concern   Not on file  Social History Narrative   Not on file   Social Drivers of Health   Financial Resource Strain: Not on file  Food Insecurity: Not on file  Transportation Needs: Not on file  Physical Activity: Not on file  Stress: Not on file  Social Connections: Not on file  Intimate Partner Violence: Not on file    Family History  Problem Relation Age of Onset   Cancer Father        lung   Hypertension Mother    Breast cancer Neg Hx     Allergies  Allergen Reactions   Sulfa Antibiotics Hives    Outpatient Medications Prior to Visit  Medication Sig   Azelaic Acid 15 % gel Apply 1 Application topically 2 (two) times daily. After skin is thoroughly washed and patted dry, gently but thoroughly massage a thin film of azelaic acid cream into the affected area twice daily, in the morning and evening.   Multiple Vitamin (MULTIVITAMIN) tablet Take 1 tablet by mouth daily.  Chewable   Omega-3 Fatty Acids (OMEGA 3 PO) Take 1 capsule by mouth daily.   progesterone (PROMETRIUM) 100 MG capsule TAKE (1) CAPSULE BY MOUTH EVERY DAY   Testosterone Micronized POWD Apply 0.25 to 0.100mL (1-2 clicks) behind alternating knees every evening.   [DISCONTINUED] triamcinolone cream (KENALOG) 0.1 % Apply 1 Application topically 2 (two) times daily.   No facility-administered medications prior to visit.    Review of Systems  Constitutional: Negative.   HENT: Negative.    Eyes: Negative.   Respiratory: Negative.  Negative for shortness of breath.   Cardiovascular: Negative.  Negative for chest pain.  Gastrointestinal: Negative.  Negative for abdominal pain, constipation and diarrhea.  Genitourinary: Negative.   Musculoskeletal:  Negative for joint pain and myalgias.  Skin:   Positive for rash.  Neurological: Negative.  Negative for dizziness and headaches.  Endo/Heme/Allergies: Negative.   All other systems reviewed and are negative.      Objective:   BP 104/68   Pulse 64   Ht 5\' 3"  (1.6 m)   Wt 121 lb 3.2 oz (55 kg)   LMP 07/24/2018   SpO2 99%   BMI 21.47 kg/m   Vitals:   09/27/23 1438  BP: 104/68  Pulse: 64  Height: 5\' 3"  (1.6 m)  Weight: 121 lb 3.2 oz (55 kg)  SpO2: 99%  BMI (Calculated): 21.48    Physical Exam Vitals and nursing note reviewed.  Constitutional:      Appearance: Normal appearance. She is normal weight.    HENT:     Head: Normocephalic and atraumatic.     Nose: Nose normal.     Mouth/Throat:     Mouth: Mucous membranes are moist.  Eyes:     Extraocular Movements: Extraocular movements intact.     Conjunctiva/sclera: Conjunctivae normal.     Pupils: Pupils are equal, round, and reactive to light.  Cardiovascular:     Rate and Rhythm: Normal rate and regular rhythm.     Pulses: Normal pulses.     Heart sounds: Normal heart sounds.  Pulmonary:     Effort: Pulmonary effort is normal.     Breath sounds: Normal breath sounds.  Abdominal:     General: Abdomen is flat. Bowel sounds are normal.     Palpations: Abdomen is soft.  Musculoskeletal:        General: Normal range of motion.     Cervical back: Normal range of motion.  Skin:    General: Skin is warm and dry.  Neurological:     General: No focal deficit present.     Mental Status: She is alert and oriented to person, place, and time.  Psychiatric:        Mood and Affect: Mood normal.        Behavior: Behavior normal.        Thought Content: Thought content normal.        Judgment: Judgment normal.      No results found for any visits on 09/27/23.  No results found for this or any previous visit (from the past 2160 hours).    Assessment & Plan:  Kenalog cream Claritin  Problem List Items Addressed This Visit       Musculoskeletal and  Integument   Eczema - Primary    Return if symptoms worsen or fail to improve.   Total time spent: 25 minutes  Google, NP  09/27/2023   This document may have been prepared by [employer] Voice Recognition software  and as such may include unintentional dictation errors.

## 2023-12-06 ENCOUNTER — Other Ambulatory Visit: Payer: Self-pay | Admitting: Internal Medicine

## 2023-12-06 DIAGNOSIS — Z1231 Encounter for screening mammogram for malignant neoplasm of breast: Secondary | ICD-10-CM

## 2023-12-26 ENCOUNTER — Ambulatory Visit
Admission: RE | Admit: 2023-12-26 | Discharge: 2023-12-26 | Disposition: A | Discharge status: Home / Self Care | Payer: 59 | Source: Ambulatory Visit | Attending: Internal Medicine | Admitting: Internal Medicine

## 2023-12-26 DIAGNOSIS — Z1231 Encounter for screening mammogram for malignant neoplasm of breast: Secondary | ICD-10-CM | POA: Insufficient documentation

## 2024-02-20 ENCOUNTER — Other Ambulatory Visit: Payer: Self-pay | Admitting: Family

## 2024-03-12 ENCOUNTER — Encounter: Payer: Self-pay | Admitting: Cardiology

## 2024-03-12 ENCOUNTER — Ambulatory Visit (INDEPENDENT_AMBULATORY_CARE_PROVIDER_SITE_OTHER): Admitting: Cardiology

## 2024-03-12 VITALS — BP 98/70 | HR 68 | Ht 63.0 in | Wt 119.4 lb

## 2024-03-12 DIAGNOSIS — Z0130 Encounter for examination of blood pressure without abnormal findings: Secondary | ICD-10-CM

## 2024-03-12 DIAGNOSIS — R531 Weakness: Secondary | ICD-10-CM

## 2024-03-12 DIAGNOSIS — R29898 Other symptoms and signs involving the musculoskeletal system: Secondary | ICD-10-CM

## 2024-03-12 NOTE — Progress Notes (Unsigned)
 Established Patient Office Visit  Subjective:  Patient ID: Ashley Hutchinson, female    DOB: [birth year]  Age: 58 y.o. MRN: [id number]  Chief Complaint  Patient presents with  . Extremity Weakness    Arm weakness    Patient in office for an acute visit, complaining of arm weakness.  Extremity Weakness  The pain is present in the right arm. This is a new problem. The current episode started 1 to 4 weeks ago. The problem occurs intermittently. The problem has been waxing and waning. The quality of the pain is described as aching. The pain is mild. Associated symptoms include an inability to bear weight. Pertinent negatives include no numbness or tingling. The symptoms are aggravated by activity. She has tried NSAIDS, cold and rest (TENs unit) for the symptoms. The treatment provided no relief.    No other concerns at this time.   Past Medical History:  Diagnosis Date  . Acute pain of left wrist 12/22/2021  . Anxiety 07/14/2021  . Arthritis    both  . Cancer (HCC)    basall cell carcinoma  . Fibroma 12/24/2021  . Ganglion cyst of dorsum of left wrist 10/18/2022  . Hypoglycemia   . Pain in finger of left hand 12/22/2021  . Stiffness of left wrist joint 12/20/2022  . Trigger middle finger of left hand 03/01/2020  . Vitamin D  deficiency 07/14/2021  . Wrist impingement syndrome 12/24/2021    Past Surgical History:  Procedure Laterality Date  . BASAL CELL CARCINOMA EXCISION    . BLEPHAROPLASTY  11/23/2000   lower eyelid  . COLONOSCOPY  09/12/2016  . DERMABRASION N/A 11/23/2020   face  . LIGAMENT REPAIR    . SQUAMOUS CELL CARCINOMA EXCISION    . WISDOM TOOTH EXTRACTION      Social History   Socioeconomic History  . Marital status: Married    Spouse name: Not on file  . Number of children: Not on file  . Years of education: Not on file  . Highest education level: Not on file  Occupational History  . Not on file  Tobacco Use  . Smoking status: Never  . Smokeless tobacco:  Never  Vaping Use  . Vaping status: Never Used  Substance and Sexual Activity  . Alcohol use: No  . Drug use: No  . Sexual activity: Yes  Other Topics Concern  . Not on file  Social History Narrative  . Not on file   Social Drivers of Health   Financial Resource Strain: Not on file  Food Insecurity: Not on file  Transportation Needs: Not on file  Physical Activity: Not on file  Stress: Not on file  Social Connections: Not on file  Intimate Partner Violence: Not on file    Family History  Problem Relation Age of Onset  . Cancer Father        lung  . Hypertension Mother   . Breast cancer Neg Hx     Allergies  Allergen Reactions  . Sulfa Antibiotics Hives    Outpatient Medications Prior to Visit  Medication Sig  . Azelaic Acid  15 % gel Apply 1 Application topically 2 (two) times daily. After skin is thoroughly washed and patted dry, gently but thoroughly massage a thin film of azelaic acid  cream into the affected area twice daily, in the morning and evening.  . Multiple Vitamin (MULTIVITAMIN) tablet Take 1 tablet by mouth daily. Chewable  . Omega-3 Fatty Acids (OMEGA 3 PO) Take 1 capsule by mouth daily.  Aaron Aas  progesterone (PROMETRIUM) 100 MG capsule TAKE (1) CAPSULE BY MOUTH EVERY DAY  . Testosterone  Micronized POWD Apply 0.25 to 0.5mL (1-2 clicks) behind alternating knees every evening.  . triamcinolone  cream (KENALOG ) 0.1 % Apply 1 Application topically 2 (two) times daily.   No facility-administered medications prior to visit.    Review of Systems  Constitutional: Negative.   HENT: Negative.    Eyes: Negative.   Respiratory: Negative.  Negative for shortness of breath.   Cardiovascular: Negative.  Negative for chest pain.  Gastrointestinal: Negative.  Negative for abdominal pain, constipation and diarrhea.  Genitourinary: Negative.   Musculoskeletal:  Positive for extremity weakness. Negative for joint pain and myalgias.  Skin: Negative.   Neurological:   Negative for dizziness, tingling, numbness and headaches.  Endo/Heme/Allergies: Negative.   All other systems reviewed and are negative.      Objective:   BP 98/70   Pulse 68   Ht 5\' 3"  (1.6 m)   Wt 119 lb 6.4 oz (54.2 kg)   LMP 07/24/2018   SpO2 98%   BMI 21.15 kg/m   Vitals:   03/12/24 1119  BP: 98/70  Pulse: 68  Height: 5\' 3"  (1.6 m)  Weight: 119 lb 6.4 oz (54.2 kg)  SpO2: 98%  BMI (Calculated): 21.16    Physical Exam Vitals and nursing note reviewed.  Constitutional:      Appearance: Normal appearance. She is normal weight.  HENT:     Head: Normocephalic and atraumatic.     Nose: Nose normal.     Mouth/Throat:     Mouth: Mucous membranes are moist.  Eyes:     Extraocular Movements: Extraocular movements intact.     Conjunctiva/sclera: Conjunctivae normal.     Pupils: Pupils are equal, round, and reactive to light.  Cardiovascular:     Rate and Rhythm: Normal rate and regular rhythm.     Pulses: Normal pulses.     Heart sounds: Normal heart sounds.  Pulmonary:     Effort: Pulmonary effort is normal.     Breath sounds: Normal breath sounds.  Abdominal:     General: Abdomen is flat. Bowel sounds are normal.     Palpations: Abdomen is soft.  Musculoskeletal:        General: Normal range of motion.     Cervical back: Normal range of motion.  Skin:    General: Skin is warm and dry.  Neurological:     General: No focal deficit present.     Mental Status: She is alert and oriented to person, place, and time.  Psychiatric:        Mood and Affect: Mood normal.        Behavior: Behavior normal.        Thought Content: Thought content normal.        Judgment: Judgment normal.     No results found for any visits on 03/12/24.  No results found for this or any previous visit (from the past 2160 hours).    Assessment & Plan:   Problem List Items Addressed This Visit   None   No follow-ups on file.   Total time spent: 25 minutes  Google,  NP  03/12/2024   This document may have been prepared by Dragon Voice Recognition software and as such may include unintentional dictation errors.

## 2024-03-27 ENCOUNTER — Other Ambulatory Visit: Payer: Self-pay | Admitting: Family

## 2024-04-01 ENCOUNTER — Telehealth: Payer: Self-pay | Admitting: Internal Medicine

## 2024-04-01 NOTE — Telephone Encounter (Signed)
 PT calling stating that she needs a refill of her Testosterone  to be sent to [facility]. States they have tried to send a request to our office 3 times..  Please adivse

## 2024-04-02 ENCOUNTER — Other Ambulatory Visit: Payer: Self-pay | Admitting: Family

## 2024-04-02 MED ORDER — TESTOSTERONE 25 MG/2.5GM (1%) TD GEL: TRANSDERMAL | 5 refills | Status: DC

## 2024-04-04 NOTE — Telephone Encounter (Signed)
 Rx was sent 6/25

## 2024-04-09 ENCOUNTER — Encounter: Payer: Self-pay | Admitting: Family

## 2024-04-09 ENCOUNTER — Ambulatory Visit: Admitting: Family

## 2024-04-09 VITALS — BP 122/71 | HR 77 | Ht 63.0 in | Wt 119.0 lb

## 2024-04-09 DIAGNOSIS — R5383 Other fatigue: Secondary | ICD-10-CM

## 2024-04-09 DIAGNOSIS — R7303 Prediabetes: Secondary | ICD-10-CM

## 2024-04-09 DIAGNOSIS — N951 Menopausal and female climacteric states: Secondary | ICD-10-CM

## 2024-04-09 DIAGNOSIS — E538 Deficiency of other specified B group vitamins: Secondary | ICD-10-CM

## 2024-04-09 DIAGNOSIS — I10 Essential (primary) hypertension: Secondary | ICD-10-CM

## 2024-04-09 DIAGNOSIS — E559 Vitamin D deficiency, unspecified: Secondary | ICD-10-CM

## 2024-04-09 DIAGNOSIS — E782 Mixed hyperlipidemia: Secondary | ICD-10-CM

## 2024-04-09 MED ORDER — ESTRADIOL 0.5 MG PO TABS: 0.5000 mg | ORAL_TABLET | Freq: Every day | ORAL | 1 refills | Status: DC

## 2024-04-09 MED ORDER — TESTOSTERONE MICRONIZED POWD: 5 refills | Status: DC

## 2024-04-10 ENCOUNTER — Other Ambulatory Visit

## 2024-04-11 LAB — CBC WITH DIFFERENTIAL/PLATELET
Basophils Absolute: 0.1 x10E3/uL (ref 0.0–0.2)
Basos: 1 %
EOS (ABSOLUTE): 0.3 x10E3/uL (ref 0.0–0.4)
Eos: 5 %
Hematocrit: 41.6 % (ref 34.0–46.6)
Hemoglobin: 13.6 g/dL (ref 11.1–15.9)
Immature Grans (Abs): 0 x10E3/uL (ref 0.0–0.1)
Immature Granulocytes: 0 %
Lymphocytes Absolute: 2.5 x10E3/uL (ref 0.7–3.1)
Lymphs: 39 %
MCH: 30.8 pg (ref 26.6–33.0)
MCHC: 32.7 g/dL (ref 31.5–35.7)
MCV: 94 fL (ref 79–97)
Monocytes Absolute: 0.5 x10E3/uL (ref 0.1–0.9)
Monocytes: 8 %
Neutrophils Absolute: 3.1 x10E3/uL (ref 1.4–7.0)
Neutrophils: 47 %
Platelets: 231 x10E3/uL (ref 150–450)
RBC: 4.42 x10E6/uL (ref 3.77–5.28)
RDW: 12.4 % (ref 11.7–15.4)
WBC: 6.5 x10E3/uL (ref 3.4–10.8)

## 2024-04-11 LAB — LIPID PANEL
Chol/HDL Ratio: 2.5 ratio (ref 0.0–4.4)
Cholesterol, Total: 182 mg/dL (ref 100–199)
HDL: 74 mg/dL (ref >39)
LDL Chol Calc (NIH): 92 mg/dL (ref 0–99)
Triglycerides: 91 mg/dL (ref 0–149)
VLDL Cholesterol Cal: 16 mg/dL (ref 5–40)

## 2024-04-11 LAB — CMP14+EGFR
ALT: 26 IU/L (ref 0–32)
AST: 29 IU/L (ref 0–40)
Albumin: 4.3 g/dL (ref 3.8–4.9)
Alkaline Phosphatase: 52 IU/L (ref 44–121)
BUN/Creatinine Ratio: 19 (ref 9–23)
BUN: 20 mg/dL (ref 6–24)
Bilirubin Total: 0.6 mg/dL (ref 0.0–1.2)
CO2: 19 mmol/L — ABNORMAL LOW (ref 20–29)
Calcium: 9.7 mg/dL (ref 8.7–10.2)
Chloride: 101 mmol/L (ref 96–106)
Creatinine, Ser: 1.05 mg/dL — ABNORMAL HIGH (ref 0.57–1.00)
Globulin, Total: 1.9 g/dL (ref 1.5–4.5)
Glucose: 93 mg/dL (ref 70–99)
Potassium: 4.2 mmol/L (ref 3.5–5.2)
Sodium: 139 mmol/L (ref 134–144)
Total Protein: 6.2 g/dL (ref 6.0–8.5)
eGFR: 62 mL/min/1.73 (ref >59)

## 2024-04-11 LAB — FSH+PROG+E2+SHBG
Estradiol: <5 pg/mL
FSH: 143 m[IU]/mL
Progesterone: 2.8 ng/mL
Sex Hormone Binding: 55.6 nmol/L (ref 17.3–125.0)

## 2024-04-11 LAB — VITAMIN D 25 HYDROXY (VIT D DEFICIENCY, FRACTURES): Vit D, 25-Hydroxy: 42.6 ng/mL (ref 30.0–100.0)

## 2024-04-11 LAB — IRON,TIBC AND FERRITIN PANEL
Ferritin: 49 ng/mL (ref 15–150)
Iron Saturation: 27 % (ref 15–55)
Iron: 90 ug/dL (ref 27–159)
Total Iron Binding Capacity: 333 ug/dL (ref 250–450)
UIBC: 243 ug/dL (ref 131–425)

## 2024-04-11 LAB — HEMOGLOBIN A1C
Est. average glucose Bld gHb Est-mCnc: 105 mg/dL
Hgb A1c MFr Bld: 5.3 % (ref 4.8–5.6)

## 2024-04-11 LAB — VITAMIN B12: Vitamin B-12: 569 pg/mL (ref 232–1245)

## 2024-04-11 LAB — TSH: TSH: 2 u[IU]/mL (ref 0.450–4.500)

## 2024-04-11 LAB — TESTOSTERONE: Testosterone: 70 ng/dL — ABNORMAL HIGH (ref 4–50)

## 2024-04-16 ENCOUNTER — Ambulatory Visit: Admitting: Family

## 2024-05-01 ENCOUNTER — Other Ambulatory Visit: Payer: Self-pay | Admitting: Family

## 2024-05-26 ENCOUNTER — Other Ambulatory Visit: Payer: Self-pay | Admitting: Family

## 2024-05-30 ENCOUNTER — Other Ambulatory Visit: Payer: Self-pay | Admitting: Family

## 2024-06-04 ENCOUNTER — Other Ambulatory Visit: Payer: Self-pay | Admitting: Cardiology

## 2024-06-26 ENCOUNTER — Other Ambulatory Visit: Payer: Self-pay | Admitting: Cardiology

## 2024-07-07 ENCOUNTER — Ambulatory Visit: Payer: Self-pay

## 2024-07-21 NOTE — Progress Notes (Unsigned)
 [facility] [address] [place] [address] Phone: [phone] Subjective:   Ashley Hutchinson, am serving as a scribe for Dr. Arthea Hutchinson.  I'm seeing this patient by the request  of:  Ashley Fredy RAMAN, MD  CC: Right arm pain  YEP:Dlagzrupcz  Ashley Hutchinson is a 58 y.o. female coming in with complaint of elbow and forearm pain. Patient states that she plays tennis. Pain began in April in her R arm. Developed pain in L arm as well. Did try PT which was not helpful. Tried prednisone which was helpful. Took MOBIC for 2 months which was not helpful. Forehand shots, serving and pronation and supination increase her pain.      Past Medical History:  Diagnosis Date   Acute pain of left wrist 12/22/2021   Anxiety 07/14/2021   Arthritis    both   Cancer (HCC)    basall cell carcinoma   Fibroma 12/24/2021   Ganglion cyst of dorsum of left wrist 10/18/2022   Hypoglycemia    Pain in finger of left hand 12/22/2021   Stiffness of left wrist joint 12/20/2022   Trigger middle finger of left hand 03/01/2020   Vitamin D  deficiency 07/14/2021   Wrist impingement syndrome 12/24/2021   Past Surgical History:  Procedure Laterality Date   BASAL CELL CARCINOMA EXCISION     BLEPHAROPLASTY  11/23/2000   lower eyelid   COLONOSCOPY  09/12/2016   DERMABRASION N/A 11/23/2020   face   LIGAMENT REPAIR     SQUAMOUS CELL CARCINOMA EXCISION     WISDOM TOOTH EXTRACTION     Social History   Socioeconomic History   Marital status: Married    Spouse name: Not on file   Number of children: Not on file   Years of education: Not on file   Highest education level: Not on file  Occupational History   Not on file  Tobacco Use   Smoking status: Never   Smokeless tobacco: Never  Vaping Use   Vaping status: Never Used  Substance and Sexual Activity   Alcohol use: No   Drug use: No   Sexual activity: Yes  Other Topics Concern   Not on file  Social History Narrative    Not on file   Social Drivers of Health   Financial Resource Strain: Not on file  Food Insecurity: Not on file  Transportation Needs: Not on file  Physical Activity: Not on file  Stress: Not on file  Social Connections: Not on file   Allergies  Allergen Reactions   Sulfa Antibiotics Hives   Family History  Problem Relation Age of Onset   Cancer Father        lung   Hypertension Mother    Breast cancer Neg Hx     Current Outpatient Medications (Endocrine & Metabolic):    estradiol  (ESTRACE ) 0.5 MG tablet, TAKE 1 TABLET BY MOUTH EVERY DAY   progesterone (PROMETRIUM) 100 MG capsule, TAKE (1) CAPSULE BY MOUTH EVERY DAY  Current Outpatient Medications (Cardiovascular):    nitroGLYCERIN (NITRO-DUR) 0.2 mg/hr patch, Apply 1/4 of a patch to skin once daily.     Current Outpatient Medications (Other):    Azelaic Acid  15 % gel, APPLY TOPICALLY 2 TIMES DAILY. AFTER SKIN IS THOROUGHLY WASHED AND PATTED DRY, GENTLY BUT THOROUGHLY MASSAGE A THIN FILM OF AZELAIC ACID  CREAM INTO THE AFFECTED AREA TWICE DAILY, IN THE MORNING AND EVENING.   Multiple Vitamin (MULTIVITAMIN) tablet, Take 1 tablet by  mouth daily. Chewable   Omega-3 Fatty Acids (OMEGA 3 PO), Take 1 capsule by mouth daily.   Testosterone  Micronized POWD, Apply 0.25 to 0.5mL (1-2 clicks) behind alternating knees every evening.   triamcinolone  cream (KENALOG ) 0.1 %, Apply 1 Application topically 2 (two) times daily.   Vitamin D , Ergocalciferol , (DRISDOL) 1.25 MG (50000 UNIT) CAPS capsule, Take 1 capsule (50,000 Units total) by mouth every 7 (seven) days.   Reviewed prior external information including notes and imaging from  primary care provider As well as notes that were available from care everywhere and other healthcare systems.  Past medical history, social, surgical and family history all reviewed in electronic medical record.  No pertanent information unless stated regarding to the chief complaint.   Review of Systems:   No headache, visual changes, nausea, vomiting, diarrhea, constipation, dizziness, abdominal pain, skin rash, fevers, chills, night sweats, weight loss, swollen lymph nodes, body aches, joint swelling, chest pain, shortness of breath, mood changes. POSITIVE muscle aches  Objective  Blood pressure 108/80, pulse 67, height 5' 3 (1.6 m), weight 120 lb (54.4 kg), last menstrual period 07/24/2018, SpO2 96%.   General: No apparent distress alert and oriented x3 mood and affect normal, dressed appropriately.  HEENT: Pupils equal, extraocular movements intact  Respiratory: Patient's speak in full sentences and does not appear short of breath  Cardiovascular: No lower extremity edema, non tender, no erythema  Right arm exam does not show any significant swelling.  Tender to palpation though over the lateral epicondylar region.  Patient has worsening pain with resisted extension of the wrist noted.  Limited muscular skeletal ultrasound was performed and interpreted by Ashley Hutchinson, M  Limited ultrasound shows the patient does have a cortical irregularity noted of the lateral epicondylar region that is consistent with an avulsion.  At this area where there is the origin of the common extensor tendon does have a hypoechoic change consistent with a small tear also noted.  Increasing in neovascularization and Doppler flow. Impression: Avulsion fracture with tendon tear    Impression and Recommendations:    Avulsion fracture of lateral epicondyle of humerus Avulsion noted, I did do see also partial tearing of the common extensor tendon that is concerning.  Discussed icing regimen and home exercises, discussed which activities to do and which ones to avoid.  Increase activity slowly.  Once weekly vitamin D  given, nitroglycerin patches and warned potential side effects.  If worsening symptoms need to seek medical attention.  Follow-up with me again 2 months to see how patient is doing.  Could be a candidate for  PRP  The above documentation has been reviewed and is accurate and complete Shekita Hutchinson M Ashley Saddler, DO

## 2024-07-23 ENCOUNTER — Encounter: Payer: Self-pay | Admitting: Family Medicine

## 2024-07-23 ENCOUNTER — Ambulatory Visit: Admitting: Family Medicine

## 2024-07-23 VITALS — BP 108/80 | HR 67 | Ht 63.0 in | Wt 120.0 lb

## 2024-07-23 DIAGNOSIS — S42434A Nondisplaced fracture (avulsion) of lateral epicondyle of right humerus, initial encounter for closed fracture: Secondary | ICD-10-CM

## 2024-07-23 DIAGNOSIS — S42433A Displaced fracture (avulsion) of lateral epicondyle of unspecified humerus, initial encounter for closed fracture: Secondary | ICD-10-CM | POA: Insufficient documentation

## 2024-07-23 MED ORDER — NITROGLYCERIN 0.2 MG/HR TD PT24: MEDICATED_PATCH | TRANSDERMAL | 0 refills | Status: AC

## 2024-07-23 MED ORDER — VITAMIN D (ERGOCALCIFEROL) 1.25 MG (50000 UNIT) PO CAPS: 50000.0000 [IU] | ORAL_CAPSULE | ORAL | 0 refills | Status: AC

## 2024-07-23 NOTE — Assessment & Plan Note (Signed)
 Avulsion noted, I did do see also partial tearing of the common extensor tendon that is concerning.  Discussed icing regimen and home exercises, discussed which activities to do and which ones to avoid.  Increase activity slowly.  Once weekly vitamin D  given, nitroglycerin patches and warned potential side effects.  If worsening symptoms need to seek medical attention.  Follow-up with me again 2 months to see how patient is doing.  Could be a candidate for PRP

## 2024-07-23 NOTE — Patient Instructions (Addendum)
 Avulsion fracture Once weekly Vit D Nitroglycerin patches Nitroglycerin Protocol   Apply 1/4 nitroglycerin patch to affected area daily.  Change position of patch within the affected area every 24 hours.  You may experience a headache during the first 1-2 weeks of using the patch, these should subside.  If you experience headaches after beginning nitroglycerin patch treatment, you may take your preferred over the counter pain reliever.  Another side effect of the nitroglycerin patch is skin irritation or rash related to patch adhesive.  Please notify our office if you develop more severe headaches or rash, and stop the patch.  Tendon healing with nitroglycerin patch may require 12 to 24 weeks depending on the extent of injury.  Men should not use if taking Viagra, Cialis, or Levitra.   Do not use if you have migraines or rosacea.  See me again in 4 weeks (ok to double book)

## 2024-07-30 ENCOUNTER — Other Ambulatory Visit: Payer: Self-pay | Admitting: Family

## 2024-08-22 ENCOUNTER — Ambulatory Visit: Admitting: Family Medicine

## 2024-08-26 NOTE — Progress Notes (Unsigned)
 [facility] [address] [place] [address] Phone: [phone] Subjective:   Ashley Hutchinson am a scribe for Dr. Claudene.  I'm seeing this patient by the request  of:  [name], MD  CC: [name]  YEP:Dlagzrupcz  07/23/2024 Avulsion noted, I did do see also partial tearing of the common extensor tendon that is concerning.  Discussed icing regimen and home exercises, discussed which activities to do and which ones to avoid.  Increase activity slowly.  Once weekly vitamin D  given, nitroglycerin patches and warned potential side effects.  If worsening symptoms need to seek medical attention.  Follow-up with me again 2 months to see how patient is doing.  Could be a candidate for PRP     Updated 08/27/2024 Ashley Hutchinson is a 58 y.o. female coming in with complaint of elbow pain. Patient states that she can still feel it. The tendinitis is a little better if she isn't doing anything. Thought it would be better than it is right now.        Past Medical History:  Diagnosis Date   Acute pain of left wrist 12/22/2021   Anxiety 07/14/2021   Arthritis    both   Cancer (HCC)    basall cell carcinoma   Fibroma 12/24/2021   Ganglion cyst of dorsum of left wrist 10/18/2022   Hypoglycemia    Pain in finger of left hand 12/22/2021   Stiffness of left wrist joint 12/20/2022   Trigger middle finger of left hand 03/01/2020   Vitamin D  deficiency 07/14/2021   Wrist impingement syndrome 12/24/2021   Past Surgical History:  Procedure Laterality Date   BASAL CELL CARCINOMA EXCISION     BLEPHAROPLASTY  11/23/2000   lower eyelid   COLONOSCOPY  09/12/2016   DERMABRASION N/A 11/23/2020   face   LIGAMENT REPAIR     SQUAMOUS CELL CARCINOMA EXCISION     WISDOM TOOTH EXTRACTION     Social History   Socioeconomic History   Marital status: Married    Spouse name: Not on file   Number of children: Not on file   Years of education: Not on  file   Highest education level: Not on file  Occupational History   Not on file  Tobacco Use   Smoking status: Never   Smokeless tobacco: Never  Vaping Use   Vaping status: Never Used  Substance and Sexual Activity   Alcohol use: No   Drug use: No   Sexual activity: Yes  Other Topics Concern   Not on file  Social History Narrative   Not on file   Social Drivers of Health   Financial Resource Strain: Not on file  Food Insecurity: Not on file  Transportation Needs: Not on file  Physical Activity: Not on file  Stress: Not on file  Social Connections: Not on file   Allergies  Allergen Reactions   Sulfa Antibiotics Hives   Family History  Problem Relation Age of Onset   Cancer Father        lung   Hypertension Mother    Breast cancer Neg Hx     Current Outpatient Medications (Endocrine & Metabolic):    estradiol  (ESTRACE ) 0.5 MG tablet, TAKE 1 TABLET BY MOUTH EVERY DAY   progesterone (PROMETRIUM) 100 MG capsule, TAKE (1) CAPSULE BY MOUTH EVERY DAY  Current Outpatient Medications (Cardiovascular):    nitroGLYCERIN (NITRO-DUR) 0.2 mg/hr patch, Apply 1/4 of a patch to skin once daily.  Current Outpatient Medications (Other):    Azelaic Acid  15 % gel, APPLY TOPICALLY 2 TIMES DAILY. AFTER SKIN IS THOROUGHLY WASHED AND PATTED DRY, GENTLY BUT THOROUGHLY MASSAGE A THIN FILM OF AZELAIC ACID  CREAM INTO THE AFFECTED AREA TWICE DAILY, IN THE MORNING AND EVENING.   Multiple Vitamin (MULTIVITAMIN) tablet, Take 1 tablet by mouth daily. Chewable   Omega-3 Fatty Acids (OMEGA 3 PO), Take 1 capsule by mouth daily.   Testosterone  Micronized POWD, Apply 0.25 to 0.5mL (1-2 clicks) behind alternating knees every evening.   triamcinolone  cream (KENALOG ) 0.1 %, Apply 1 Application topically 2 (two) times daily.   Vitamin D , Ergocalciferol , (DRISDOL) 1.25 MG (50000 UNIT) CAPS capsule, Take 1 capsule (50,000 Units total) by mouth every 7 (seven) days.   Reviewed prior external  information including notes and imaging from  primary care provider As well as notes that were available from care everywhere and other healthcare systems.  Past medical history, social, surgical and family history all reviewed in electronic medical record.  No pertanent information unless stated regarding to the chief complaint.   Review of Systems:  No headache, visual changes, nausea, vomiting, diarrhea, constipation, dizziness, abdominal pain, skin rash, fevers, chills, night sweats, weight loss, swollen lymph nodes, body aches, joint swelling, chest pain, shortness of breath, mood changes. POSITIVE muscle aches  Objective  Blood pressure 90/60, pulse 62, height 5' 3 (1.6 m), last menstrual period 07/24/2018, SpO2 98%.   General: No apparent distress alert and oriented x3 mood and affect normal, dressed appropriately.  HEENT: Pupils equal, extraocular movements intact  Respiratory: Patient's speak in full sentences and does not appear short of breath  Cardiovascular: No lower extremity edema, non tender, no erythema  Right elbow exam shows improvement in the swelling in the surrounding area.  Patient is still minorly tender to palpation over the lateral epicondylar area.  Patient does have more strength though noted of the forearm.  Limited muscular skeletal ultrasound was performed and interpreted by CLAUDENE HUSSAR, M  Limited ultrasound shows that patient still has some very mild cortical irregularity noted but improvement noted.  Patient does still have some increase in Doppler flow of the common extensor tendon but does appear to have improvement noted.    Impression and Recommendations:     The above documentation has been reviewed and is accurate and complete Cort Dragoo M Oaklie Durrett, DO

## 2024-08-27 ENCOUNTER — Ambulatory Visit (INDEPENDENT_AMBULATORY_CARE_PROVIDER_SITE_OTHER): Admitting: Family Medicine

## 2024-08-27 ENCOUNTER — Other Ambulatory Visit: Payer: Self-pay

## 2024-08-27 ENCOUNTER — Encounter: Payer: Self-pay | Admitting: Family Medicine

## 2024-08-27 VITALS — BP 90/60 | HR 62 | Ht 63.0 in

## 2024-08-27 DIAGNOSIS — M25521 Pain in right elbow: Secondary | ICD-10-CM | POA: Diagnosis not present

## 2024-08-27 DIAGNOSIS — S42434A Nondisplaced fracture (avulsion) of lateral epicondyle of right humerus, initial encounter for closed fracture: Secondary | ICD-10-CM | POA: Diagnosis not present

## 2024-08-27 NOTE — Patient Instructions (Addendum)
 Good to see you. Ok to start working out 1 time a week for two weeks. Then 2 times a week afterwards. Can try Nitro patches. Shop for stand for your tablet.  Write me in January. See me again in 2 months.

## 2024-08-27 NOTE — Assessment & Plan Note (Signed)
 Improvement noted the patient is still symptomatic.  We discussed with patient at great length about icing regimen, home exercises, increase activity slowly.  We discussed the possibility of PRP but patient wants to continue with the nitroglycerin  patches.  Held on any other type of steroid injection at the moment.  Follow-up with me again in 6 to 12 weeks to make sure patient is completely healed.

## 2024-09-22 ENCOUNTER — Ambulatory Visit: Admitting: Family Medicine

## 2024-10-29 NOTE — Progress Notes (Unsigned)
 " [facility] [address] [place] [address] Phone: [phone] Subjective:   Ashley Hutchinson, am serving as a scribe for Dr. Arthea Hutchinson.  I'm seeing this patient by the request  of:  Ashley Fredy RAMAN, MD  CC: Right elbow pain arm pain  YEP:Dlagzrupcz  08/27/2024 Improvement noted the patient is still symptomatic.  We discussed with patient at great length about icing regimen, home exercises, increase activity slowly.  We discussed the possibility of PRP but patient wants to continue with the nitroglycerin  patches.  Held on any other type of steroid injection at the moment.  Follow-up with me again in 6 to 12 weeks to make sure patient is completely healed.     Updated 10/31/2024 Ashley Hutchinson is a 59 y.o. female coming in with complaint of R elbow pain. Elbow is better but tendonitis still there. Forearms R is worse than L and pain is radiating above elbow.       Past Medical History:  Diagnosis Date   Acute pain of left wrist 12/22/2021   Anxiety 07/14/2021   Arthritis    both   Cancer (HCC)    basall cell carcinoma   Fibroma 12/24/2021   Ganglion cyst of dorsum of left wrist 10/18/2022   Hypoglycemia    Pain in finger of left hand 12/22/2021   Stiffness of left wrist joint 12/20/2022   Trigger middle finger of left hand 03/01/2020   Vitamin D  deficiency 07/14/2021   Wrist impingement syndrome 12/24/2021   Past Surgical History:  Procedure Laterality Date   BASAL CELL CARCINOMA EXCISION     BLEPHAROPLASTY  11/23/2000   lower eyelid   COLONOSCOPY  09/12/2016   DERMABRASION N/A 11/23/2020   face   LIGAMENT REPAIR     SQUAMOUS CELL CARCINOMA EXCISION     WISDOM TOOTH EXTRACTION     Social History   Socioeconomic History   Marital status: Married    Spouse name: Not on file   Number of children: Not on file   Years of education: Not on file   Highest education level: Not on file  Occupational History   Not on file  Tobacco  Use   Smoking status: Never   Smokeless tobacco: Never  Vaping Use   Vaping status: Never Used  Substance and Sexual Activity   Alcohol use: No   Drug use: No   Sexual activity: Yes  Other Topics Concern   Not on file  Social History Narrative   Not on file   Social Drivers of Health   Tobacco Use: Low Risk (08/27/2024)   Patient History    Smoking Tobacco Use: Never    Smokeless Tobacco Use: Never    Passive Exposure: Not on file  Financial Resource Strain: Not on file  Food Insecurity: Not on file  Transportation Needs: Not on file  Physical Activity: Not on file  Stress: Not on file  Social Connections: Not on file  Depression (PHQ2-9): Not on file  Alcohol Screen: Not on file  Housing: Not on file  Utilities: Not on file  Health Literacy: Not on file   Allergies[1] Family History  Problem Relation Age of Onset   Cancer Father        lung   Hypertension Mother    Breast cancer Neg Hx     Current Outpatient Medications (Endocrine & Metabolic):    estradiol  (ESTRACE ) 0.5 MG tablet, TAKE 1 TABLET BY MOUTH EVERY DAY  progesterone (PROMETRIUM) 100 MG capsule, TAKE (1) CAPSULE BY MOUTH EVERY DAY  Current Outpatient Medications (Cardiovascular):    nitroGLYCERIN  (NITRO-DUR ) 0.2 mg/hr patch, Apply 1/4 of a patch to skin once daily.  Current Outpatient Medications (Other):    Azelaic Acid  15 % gel, APPLY TOPICALLY 2 TIMES DAILY. AFTER SKIN IS THOROUGHLY WASHED AND PATTED DRY, GENTLY BUT THOROUGHLY MASSAGE A THIN FILM OF AZELAIC ACID  CREAM INTO THE AFFECTED AREA TWICE DAILY, IN THE MORNING AND EVENING.   Multiple Vitamin (MULTIVITAMIN) tablet, Take 1 tablet by mouth daily. Chewable   Omega-3 Fatty Acids (OMEGA 3 PO), Take 1 capsule by mouth daily.   Testosterone  Micronized POWD, Apply 0.25 to 0.5mL (1-2 clicks) behind alternating knees every evening.   triamcinolone  cream (KENALOG ) 0.1 %, Apply 1 Application topically 2 (two) times daily.   Vitamin D , Ergocalciferol ,  (DRISDOL ) 1.25 MG (50000 UNIT) CAPS capsule, Take 1 capsule (50,000 Units total) by mouth every 7 (seven) days.   Reviewed prior external information including notes and imaging from  primary care provider As well as notes that were available from care everywhere and other healthcare systems.  Past medical history, social, surgical and family history all reviewed in electronic medical record.  No pertanent information unless stated regarding to the chief complaint.   Review of Systems:  No headache, visual changes, nausea, vomiting, diarrhea, constipation, dizziness, abdominal pain, skin rash, fevers, chills, night sweats, weight loss, swollen lymph nodes, body aches, joint swelling, chest pain, shortness of breath, mood changes. POSITIVE muscle aches  Objective  Blood pressure 112/78, pulse 72, height 5' 3 (1.6 m), weight 122 lb (55.3 kg), last menstrual period 07/24/2018, SpO2 99%.   General: No apparent distress alert and oriented x3 mood and affect normal, dressed appropriately.  HEENT: Pupils equal, extraocular movements intact  Respiratory: Patient's speak in full sentences and does not appear short of breath  Cardiovascular: No lower extremity edema, non tender, no erythema  Right arm does have weakness noted in the C8 distribution.  Neck exam does have some limited range of motion noted. Mild weakness on the right side with grip strength Limited muscular skeletal ultrasound was performed and interpreted by Ashley Hutchinson, M  Limited ultrasound shows that it appears that patient does have no significant avulsion noted at this time.  Seems to be fully healed at this time.    Impression and Recommendations:    The above documentation has been reviewed and is accurate and complete Hutchinson Ashley Claudene, DO        [1]  Allergies Allergen Reactions   Sulfa Antibiotics Hives   "

## 2024-10-31 ENCOUNTER — Ambulatory Visit

## 2024-10-31 ENCOUNTER — Other Ambulatory Visit: Payer: Self-pay

## 2024-10-31 ENCOUNTER — Ambulatory Visit: Admitting: Family Medicine

## 2024-10-31 ENCOUNTER — Other Ambulatory Visit: Payer: Self-pay | Admitting: Family

## 2024-10-31 ENCOUNTER — Encounter: Payer: Self-pay | Admitting: Family Medicine

## 2024-10-31 VITALS — BP 112/78 | HR 72 | Ht 63.0 in | Wt 122.0 lb

## 2024-10-31 DIAGNOSIS — M25521 Pain in right elbow: Secondary | ICD-10-CM

## 2024-10-31 DIAGNOSIS — R29898 Other symptoms and signs involving the musculoskeletal system: Secondary | ICD-10-CM

## 2024-10-31 DIAGNOSIS — S42434A Nondisplaced fracture (avulsion) of lateral epicondyle of right humerus, initial encounter for closed fracture: Secondary | ICD-10-CM

## 2024-10-31 NOTE — Patient Instructions (Signed)
 [facility] [phone] Call Today  When we receive your results we will contact you. Xray today

## 2024-10-31 NOTE — Assessment & Plan Note (Signed)
 Seems to be fully healed at this time and seems to be more secondary to cervical radiculopathy

## 2024-10-31 NOTE — Assessment & Plan Note (Signed)
 Patient found to have some weakness noted in the C8 distribution.  Some range of motion of the neck and do feel that the arm weakness is not there and likely secondary to a pinched nerve in the cervical region.  History of cervical degenerative disc from MRI in 2020.  At this point I do think patient has done 9 months of physical therapy, dry needling, home exercises, different medications with very minimal improvement.  Do feel advanced imaging is warranted.  Depending on the findings would discuss the possibility of an epidural.  Follow-up with me again in 6 to 12 weeks otherwise.

## 2024-11-03 ENCOUNTER — Ambulatory Visit: Payer: Self-pay | Admitting: Family Medicine

## 2024-11-06 ENCOUNTER — Ambulatory Visit
Admission: RE | Admit: 2024-11-06 | Discharge: 2024-11-06 | Disposition: A | Discharge status: Home / Self Care | Source: Ambulatory Visit | Attending: Family Medicine | Admitting: Family Medicine

## 2024-11-06 DIAGNOSIS — M25521 Pain in right elbow: Secondary | ICD-10-CM

## 2024-11-11 ENCOUNTER — Other Ambulatory Visit: Payer: Self-pay

## 2024-11-11 DIAGNOSIS — M5412 Radiculopathy, cervical region: Secondary | ICD-10-CM

## 2024-11-12 ENCOUNTER — Encounter: Payer: Self-pay | Admitting: Family Medicine

## 2024-11-12 NOTE — Discharge Instructions (Signed)

## 2024-11-13 ENCOUNTER — Inpatient Hospital Stay: Admission: RE | Admit: 2024-11-13 | Discharge: 2024-11-13 | Attending: Family Medicine | Admitting: Family Medicine

## 2024-11-13 DIAGNOSIS — M5412 Radiculopathy, cervical region: Secondary | ICD-10-CM

## 2024-11-13 MED ORDER — IOPAMIDOL (ISOVUE-M 300) INJECTION 61%
1.0000 mL | Freq: Once | INTRAMUSCULAR | Status: AC | PRN
  Administered 2024-11-13: 1 mL via EPIDURAL

## 2024-11-13 MED ORDER — TRIAMCINOLONE ACETONIDE 40 MG/ML IJ SUSP (RADIOLOGY)
60.0000 mg | Freq: Once | INTRAMUSCULAR | Status: AC
  Administered 2024-11-13: 60 mg via EPIDURAL

## 2024-12-25 ENCOUNTER — Ambulatory Visit: Admitting: Family Medicine
